# Patient Record
Sex: Female | Born: 1945 | Race: White | Hispanic: No | Smoking: Former smoker
Health system: Southern US, Community
[De-identification: ages and names within clinical notes are randomized; demographics above are authoritative.]

## PROBLEM LIST (undated history)

## (undated) DIAGNOSIS — C801 Malignant (primary) neoplasm, unspecified: Secondary | ICD-10-CM

## (undated) DIAGNOSIS — I10 Essential (primary) hypertension: Secondary | ICD-10-CM

## (undated) DIAGNOSIS — E079 Disorder of thyroid, unspecified: Secondary | ICD-10-CM

## (undated) DIAGNOSIS — E785 Hyperlipidemia, unspecified: Secondary | ICD-10-CM

## (undated) DIAGNOSIS — F419 Anxiety disorder, unspecified: Secondary | ICD-10-CM

## (undated) HISTORY — PX: LARYNGETOMY: SHX5202

## (undated) HISTORY — DX: Anxiety disorder, unspecified: F41.9

## (undated) HISTORY — DX: Disorder of thyroid, unspecified: E07.9

## (undated) HISTORY — DX: Malignant (primary) neoplasm, unspecified: C80.1

## (undated) HISTORY — PX: TRACHEOSTOMY: SUR1362

## (undated) HISTORY — DX: Hyperlipidemia, unspecified: E78.5

## (undated) HISTORY — DX: Essential (primary) hypertension: I10

---

## 2016-05-04 DIAGNOSIS — M961 Postlaminectomy syndrome, not elsewhere classified: Secondary | ICD-10-CM | POA: Diagnosis not present

## 2016-05-04 DIAGNOSIS — G894 Chronic pain syndrome: Secondary | ICD-10-CM | POA: Diagnosis not present

## 2016-05-04 DIAGNOSIS — G890 Central pain syndrome: Secondary | ICD-10-CM | POA: Diagnosis not present

## 2016-05-10 DIAGNOSIS — C328 Malignant neoplasm of overlapping sites of larynx: Secondary | ICD-10-CM | POA: Diagnosis not present

## 2016-06-02 DIAGNOSIS — G890 Central pain syndrome: Secondary | ICD-10-CM | POA: Diagnosis not present

## 2016-06-02 DIAGNOSIS — G894 Chronic pain syndrome: Secondary | ICD-10-CM | POA: Diagnosis not present

## 2016-06-14 DIAGNOSIS — Z0389 Encounter for observation for other suspected diseases and conditions ruled out: Secondary | ICD-10-CM | POA: Diagnosis not present

## 2016-06-14 DIAGNOSIS — R918 Other nonspecific abnormal finding of lung field: Secondary | ICD-10-CM | POA: Diagnosis not present

## 2016-06-14 DIAGNOSIS — C328 Malignant neoplasm of overlapping sites of larynx: Secondary | ICD-10-CM | POA: Diagnosis not present

## 2016-06-30 DIAGNOSIS — K219 Gastro-esophageal reflux disease without esophagitis: Secondary | ICD-10-CM | POA: Diagnosis not present

## 2016-06-30 DIAGNOSIS — E279 Disorder of adrenal gland, unspecified: Secondary | ICD-10-CM | POA: Diagnosis not present

## 2016-06-30 DIAGNOSIS — E039 Hypothyroidism, unspecified: Secondary | ICD-10-CM | POA: Diagnosis not present

## 2016-06-30 DIAGNOSIS — I10 Essential (primary) hypertension: Secondary | ICD-10-CM | POA: Diagnosis not present

## 2016-06-30 DIAGNOSIS — E784 Other hyperlipidemia: Secondary | ICD-10-CM | POA: Diagnosis not present

## 2016-07-08 DIAGNOSIS — K603 Anal fistula: Secondary | ICD-10-CM | POA: Diagnosis not present

## 2016-07-08 DIAGNOSIS — K602 Anal fissure, unspecified: Secondary | ICD-10-CM | POA: Diagnosis not present

## 2016-07-14 DIAGNOSIS — G894 Chronic pain syndrome: Secondary | ICD-10-CM | POA: Diagnosis not present

## 2016-07-14 DIAGNOSIS — G890 Central pain syndrome: Secondary | ICD-10-CM | POA: Diagnosis not present

## 2016-08-11 DIAGNOSIS — G894 Chronic pain syndrome: Secondary | ICD-10-CM | POA: Diagnosis not present

## 2016-08-11 DIAGNOSIS — G890 Central pain syndrome: Secondary | ICD-10-CM | POA: Diagnosis not present

## 2016-08-15 DIAGNOSIS — C329 Malignant neoplasm of larynx, unspecified: Secondary | ICD-10-CM | POA: Diagnosis not present

## 2016-08-15 DIAGNOSIS — E7800 Pure hypercholesterolemia, unspecified: Secondary | ICD-10-CM | POA: Diagnosis not present

## 2016-08-15 DIAGNOSIS — J398 Other specified diseases of upper respiratory tract: Secondary | ICD-10-CM | POA: Diagnosis not present

## 2016-08-15 DIAGNOSIS — R491 Aphonia: Secondary | ICD-10-CM | POA: Diagnosis not present

## 2016-08-22 DIAGNOSIS — R9431 Abnormal electrocardiogram [ECG] [EKG]: Secondary | ICD-10-CM | POA: Diagnosis not present

## 2016-08-22 DIAGNOSIS — Z01811 Encounter for preprocedural respiratory examination: Secondary | ICD-10-CM | POA: Diagnosis not present

## 2016-08-29 DIAGNOSIS — R21 Rash and other nonspecific skin eruption: Secondary | ICD-10-CM | POA: Diagnosis not present

## 2016-08-29 DIAGNOSIS — L309 Dermatitis, unspecified: Secondary | ICD-10-CM | POA: Diagnosis not present

## 2016-08-31 DIAGNOSIS — J9503 Malfunction of tracheostomy stoma: Secondary | ICD-10-CM | POA: Diagnosis not present

## 2016-08-31 DIAGNOSIS — Z923 Personal history of irradiation: Secondary | ICD-10-CM | POA: Diagnosis not present

## 2016-08-31 DIAGNOSIS — Z87891 Personal history of nicotine dependence: Secondary | ICD-10-CM | POA: Diagnosis not present

## 2016-08-31 DIAGNOSIS — I10 Essential (primary) hypertension: Secondary | ICD-10-CM | POA: Diagnosis not present

## 2016-08-31 DIAGNOSIS — Z0389 Encounter for observation for other suspected diseases and conditions ruled out: Secondary | ICD-10-CM | POA: Diagnosis not present

## 2016-08-31 DIAGNOSIS — J9509 Other tracheostomy complication: Secondary | ICD-10-CM | POA: Diagnosis not present

## 2016-08-31 DIAGNOSIS — J398 Other specified diseases of upper respiratory tract: Secondary | ICD-10-CM | POA: Diagnosis not present

## 2016-08-31 DIAGNOSIS — Z8521 Personal history of malignant neoplasm of larynx: Secondary | ICD-10-CM | POA: Diagnosis not present

## 2016-09-01 DIAGNOSIS — Z923 Personal history of irradiation: Secondary | ICD-10-CM | POA: Diagnosis not present

## 2016-09-01 DIAGNOSIS — Z87891 Personal history of nicotine dependence: Secondary | ICD-10-CM | POA: Diagnosis not present

## 2016-09-01 DIAGNOSIS — J398 Other specified diseases of upper respiratory tract: Secondary | ICD-10-CM | POA: Diagnosis not present

## 2016-09-01 DIAGNOSIS — I10 Essential (primary) hypertension: Secondary | ICD-10-CM | POA: Diagnosis not present

## 2016-09-01 DIAGNOSIS — Z8521 Personal history of malignant neoplasm of larynx: Secondary | ICD-10-CM | POA: Diagnosis not present

## 2016-09-09 DIAGNOSIS — C329 Malignant neoplasm of larynx, unspecified: Secondary | ICD-10-CM | POA: Diagnosis not present

## 2016-09-14 DIAGNOSIS — G894 Chronic pain syndrome: Secondary | ICD-10-CM | POA: Diagnosis not present

## 2016-09-14 DIAGNOSIS — G890 Central pain syndrome: Secondary | ICD-10-CM | POA: Diagnosis not present

## 2016-09-30 DIAGNOSIS — C329 Malignant neoplasm of larynx, unspecified: Secondary | ICD-10-CM | POA: Diagnosis not present

## 2016-10-03 DIAGNOSIS — I10 Essential (primary) hypertension: Secondary | ICD-10-CM | POA: Diagnosis not present

## 2016-10-03 DIAGNOSIS — E784 Other hyperlipidemia: Secondary | ICD-10-CM | POA: Diagnosis not present

## 2016-10-03 DIAGNOSIS — R918 Other nonspecific abnormal finding of lung field: Secondary | ICD-10-CM | POA: Diagnosis not present

## 2016-10-03 DIAGNOSIS — Z86008 Personal history of in-situ neoplasm of other site: Secondary | ICD-10-CM | POA: Diagnosis not present

## 2016-10-03 DIAGNOSIS — K219 Gastro-esophageal reflux disease without esophagitis: Secondary | ICD-10-CM | POA: Diagnosis not present

## 2016-10-03 DIAGNOSIS — Z9889 Other specified postprocedural states: Secondary | ICD-10-CM | POA: Diagnosis not present

## 2016-10-03 DIAGNOSIS — E279 Disorder of adrenal gland, unspecified: Secondary | ICD-10-CM | POA: Diagnosis not present

## 2016-10-03 DIAGNOSIS — E039 Hypothyroidism, unspecified: Secondary | ICD-10-CM | POA: Diagnosis not present

## 2016-10-03 DIAGNOSIS — J432 Centrilobular emphysema: Secondary | ICD-10-CM | POA: Diagnosis not present

## 2016-12-12 ENCOUNTER — Ambulatory Visit (INDEPENDENT_AMBULATORY_CARE_PROVIDER_SITE_OTHER): Payer: Medicare Other | Admitting: Family Medicine

## 2016-12-12 ENCOUNTER — Encounter: Payer: Self-pay | Admitting: Family Medicine

## 2016-12-12 VITALS — BP 122/80 | HR 72 | Resp 12 | Ht 63.25 in | Wt 166.5 lb

## 2016-12-12 DIAGNOSIS — E785 Hyperlipidemia, unspecified: Secondary | ICD-10-CM

## 2016-12-12 DIAGNOSIS — E039 Hypothyroidism, unspecified: Secondary | ICD-10-CM | POA: Diagnosis not present

## 2016-12-12 DIAGNOSIS — I10 Essential (primary) hypertension: Secondary | ICD-10-CM | POA: Diagnosis not present

## 2016-12-12 DIAGNOSIS — G894 Chronic pain syndrome: Secondary | ICD-10-CM | POA: Diagnosis not present

## 2016-12-12 DIAGNOSIS — Z930 Tracheostomy status: Secondary | ICD-10-CM | POA: Diagnosis not present

## 2016-12-12 DIAGNOSIS — Z8521 Personal history of malignant neoplasm of larynx: Secondary | ICD-10-CM

## 2016-12-12 DIAGNOSIS — F419 Anxiety disorder, unspecified: Secondary | ICD-10-CM | POA: Diagnosis not present

## 2016-12-12 MED ORDER — ATORVASTATIN CALCIUM 10 MG PO TABS: 10.0000 mg | ORAL_TABLET | Freq: Every day | ORAL | 3 refills | Status: DC

## 2016-12-12 MED ORDER — OXYCODONE-ACETAMINOPHEN 5-325 MG PO TABS: 1.0000 | ORAL_TABLET | Freq: Two times a day (BID) | ORAL | 0 refills | Status: DC | PRN

## 2016-12-12 MED ORDER — CARVEDILOL 6.25 MG PO TABS: 6.2500 mg | ORAL_TABLET | Freq: Two times a day (BID) | ORAL | 1 refills | Status: DC

## 2016-12-12 MED ORDER — LOSARTAN POTASSIUM 50 MG PO TABS: 50.0000 mg | ORAL_TABLET | Freq: Every day | ORAL | 1 refills | Status: DC

## 2016-12-12 MED ORDER — LEVOTHYROXINE SODIUM 88 MCG PO TABS: 88.0000 ug | ORAL_TABLET | Freq: Every day | ORAL | 2 refills | Status: DC

## 2016-12-12 MED ORDER — AMLODIPINE BESYLATE 10 MG PO TABS: 10.0000 mg | ORAL_TABLET | Freq: Every day | ORAL | 2 refills | Status: DC

## 2016-12-12 NOTE — Patient Instructions (Addendum)
A few things to remember from today's visit:   Chronic pain disorder - Plan: Ambulatory referral to Pain Clinic, amLODipine (NORVASC) 10 MG tablet, oxyCODONE-acetaminophen (PERCOCET/ROXICET) 5-325 MG tablet  Hypothyroidism, unspecified type - Plan: levothyroxine (SYNTHROID, LEVOTHROID) 88 MCG tablet  Hypertension, essential, benign - Plan: carvedilol (COREG) 6.25 MG tablet, losartan (COZAAR) 50 MG tablet  Hyperlipidemia, unspecified hyperlipidemia type - Plan: atorvastatin (LIPITOR) 10 MG tablet  Anxiety disorder, unspecified type  Tracheostomy status ([place]) - Plan: Ambulatory referral to ENT, oxyCODONE-acetaminophen (PERCOCET/ROXICET) 5-325 MG tablet  History of laryngeal cancer - Plan: Ambulatory referral to ENT, Ambulatory referral to Oncology   Referral placed. No changes today.  Please be sure medication list is accurate. If a new problem present, please set up appointment sooner than planned today.

## 2016-12-12 NOTE — Progress Notes (Signed)
HPI:   Erika Preston is a 71 y.o. female, who is here today to establish care.  She is here today with her son, she has trach (s/p laryngectomy). Her son is viiting from [place] and helps with answering questions.  Also here is her grandson.  She lives with her daughter, who is out of town, so could not be here.   Former PCP: Dr Kathleen Lime, moved from [place] in 10/2016.  Last preventive routine visit: within the past year.   Chronic medical problems: HTN, hypothyroidism,HLD, and chronic pain among some.  Hypertension:  Dx around 2014.  Currently on Carvedilol 6.25 mg bid, Amlodipine 10 mg daily, and Cozaar 50 mg daily.  She is taking medications as instructed, no side effects reported.  She has not noted unusual headache, visual changes, exertional chest pain, dyspnea,  focal weakness, or edema.   Concerns today: Medication refills, including Percocet.  Requesting referrals to pain management, oncologists,and ENT.   She is on Percocet for pain and cough management, both related to her Hx of laryngeal cancer. Laryngeal cancer Dx in ? 2015. She underwent surgery,chemo, and radiotherapy. She has no residual dysphagia.  She is on Percocet 5-325 mg bid as needed. She still has tabs left, last filled on   She is also on Cymbalta 60 mg, she is not sure about indication but she has Hx of anxiety.  HLD: She is on Lipitor 10 mg daily.  She does not follow a healthy diet or exercise regularly.  Hypothyroidism:  Currently she is on Levothyroxine 88 mcg daily. Tolerating medication well, no side effects reported. She has not noted palpitations, abdominal pain, changes in bowel habits, tremor, cold/heat intolerance, or abnormal weight loss.   Review of Systems  Constitutional: Negative for activity change, appetite change, fatigue and fever.  HENT: Negative for facial swelling, mouth sores, nosebleeds, sore throat and trouble swallowing.   Eyes: Negative for redness and  visual disturbance.  Respiratory: Positive for cough (stable). Negative for shortness of breath and wheezing.   Cardiovascular: Negative for chest pain, palpitations and leg swelling.  Gastrointestinal: Negative for abdominal pain, nausea and vomiting.       Negative for changes in bowel habits.  Endocrine: Negative for cold intolerance and heat intolerance.  Genitourinary: Negative for decreased urine volume, dysuria and hematuria.  Musculoskeletal: Negative for gait problem and myalgias.  Skin: Negative for rash.  Neurological: Negative for syncope, weakness and headaches.  Psychiatric/Behavioral: Negative for confusion. The patient is nervous/anxious.     No current outpatient prescriptions on file prior to visit.   No current facility-administered medications on file prior to visit.      Past Medical History:  Diagnosis Date  . Anxiety   . Cancer (HCC)    laryngeal  . Hyperlipidemia   . Hypertension   . Thyroid disease    Past Surgical History:  Procedure Laterality Date  . LARYNGETOMY    . TRACHEOSTOMY      No Known Allergies  Family History  Problem Relation Age of Onset  . Arthritis Mother   . Cancer Neg Hx   . Diabetes Neg Hx     Social History   Social History  . Marital status: Divorced    Spouse name: N/A  . Number of children: N/A  . Years of education: N/A   Social History Main Topics  . Smoking status: Former Research scientist (life sciences)  . Smokeless tobacco: Never Used  . Alcohol use Yes  . Drug  use: No  . Sexual activity: Not Currently   Other Topics Concern  . None   Social History Narrative  . None    Vitals:   12/12/16 1352  BP: 122/80  Pulse: 72  Resp: 12  SpO2: 96%    Body mass index is 29.26 kg/m.   Physical Exam  Nursing note and vitals reviewed. Constitutional: She is oriented to person, place, and time. She appears well-developed. No distress.  HENT:  Head: Normocephalic and atraumatic.  Mouth/Throat: Oropharynx is clear and moist  and mucous membranes are normal.  Eyes: Pupils are equal, round, and reactive to light. Conjunctivae and EOM are normal.  Neck: No tracheal tenderness present. No tracheal deviation present.  Trach in place.  Cardiovascular: Normal rate and regular rhythm.   No murmur heard. Pulses:      Dorsalis pedis pulses are 2+ on the right side, and 2+ on the left side.  Respiratory: Effort normal and breath sounds normal. No respiratory distress.  Transmitted noise from upper airway.  GI: Soft. She exhibits no mass. There is no hepatomegaly. There is no tenderness.  Musculoskeletal: She exhibits no edema or tenderness.  Lymphadenopathy:    She has no cervical adenopathy.  Neurological: She is alert and oriented to person, place, and time. She has normal strength. She displays tremor (Mild head tremor.). Gait normal.  Skin: Skin is warm. No erythema.  Psychiatric: Her mood appears anxious.  Well groomed, good eye contact.    ASSESSMENT AND PLAN:   [name] was seen today for establish care.  Diagnoses and all orders for this visit:  History of laryngeal cancer  Communication is difficult and her son does not have detail information about this problem. We are waiting for records from former PCP. Referral placed.  -     Ambulatory referral to ENT -     Ambulatory referral to Oncology  Chronic pain disorder  Residual after laryngostomy and radiation therapy. Today I gave her a month Rx for Percocet, post dated. Educated about regulations in regard to opioid management and side effects. Referral placed as requested.  -     Ambulatory referral to Pain Clinic -     amLODipine (NORVASC) 10 MG tablet; Take 1 tablet (10 mg total) by mouth daily. -     oxyCODONE-acetaminophen (PERCOCET/ROXICET) 5-325 MG tablet; Take 1 tablet by mouth 2 (two) times daily as needed for severe pain.  Hypothyroidism, unspecified type  Reporting labs done in 6-10/2016. No changes in current  management. F/U in 4 months.  -     levothyroxine (SYNTHROID, LEVOTHROID) 88 MCG tablet; Take 1 tablet (88 mcg total) by mouth daily.  Hypertension, essential, benign  Adequately controlled. No changes in current management. DASH diet recommended. Eye exam recommended annually. F/U in 4 months, before if needed.  -     carvedilol (COREG) 6.25 MG tablet; Take 1 tablet (6.25 mg total) by mouth 2 (two) times daily. -     losartan (COZAAR) 50 MG tablet; Take 1 tablet (50 mg total) by mouth daily.  Hyperlipidemia, unspecified hyperlipidemia type  Stable, per pt report FLP a couple months ago. No changes in current management. F/U in 6-12 months.  -     atorvastatin (LIPITOR) 10 MG tablet; Take 1 tablet (10 mg total) by mouth daily.  Anxiety disorder, unspecified type  Continue Cymbalta 60 mg daily. F/U in 3-4 months.  Tracheostomy status ([place])  Continue following with ENT.  -     Ambulatory  referral to ENT -     oxyCODONE-acetaminophen (PERCOCET/ROXICET) 5-325 MG tablet; Take 1 tablet by mouth 2 (two) times daily as needed for severe pain.    She has not had colonoscopy or mammogram, refused both.   Ege Muckey G. Martinique, MD  [facility] [place] office.

## 2016-12-15 ENCOUNTER — Encounter: Payer: Self-pay | Admitting: Family Medicine

## 2016-12-20 DIAGNOSIS — Z8521 Personal history of malignant neoplasm of larynx: Secondary | ICD-10-CM | POA: Diagnosis not present

## 2016-12-20 DIAGNOSIS — Z938 Other artificial opening status: Secondary | ICD-10-CM | POA: Diagnosis not present

## 2016-12-20 DIAGNOSIS — L598 Other specified disorders of the skin and subcutaneous tissue related to radiation: Secondary | ICD-10-CM | POA: Diagnosis not present

## 2016-12-20 DIAGNOSIS — Z9002 Acquired absence of larynx: Secondary | ICD-10-CM | POA: Diagnosis not present

## 2016-12-20 DIAGNOSIS — Z87891 Personal history of nicotine dependence: Secondary | ICD-10-CM | POA: Diagnosis not present

## 2016-12-20 DIAGNOSIS — Z923 Personal history of irradiation: Secondary | ICD-10-CM | POA: Diagnosis not present

## 2016-12-20 DIAGNOSIS — Z963 Presence of artificial larynx: Secondary | ICD-10-CM | POA: Diagnosis not present

## 2016-12-20 DIAGNOSIS — Z7289 Other problems related to lifestyle: Secondary | ICD-10-CM | POA: Diagnosis not present

## 2016-12-21 DIAGNOSIS — M25774 Osteophyte, right foot: Secondary | ICD-10-CM | POA: Diagnosis not present

## 2016-12-21 DIAGNOSIS — L600 Ingrowing nail: Secondary | ICD-10-CM | POA: Diagnosis not present

## 2017-01-05 DIAGNOSIS — G894 Chronic pain syndrome: Secondary | ICD-10-CM | POA: Diagnosis not present

## 2017-01-05 DIAGNOSIS — Z79891 Long term (current) use of opiate analgesic: Secondary | ICD-10-CM | POA: Diagnosis not present

## 2017-01-05 DIAGNOSIS — M542 Cervicalgia: Secondary | ICD-10-CM | POA: Diagnosis not present

## 2017-01-05 DIAGNOSIS — Z79899 Other long term (current) drug therapy: Secondary | ICD-10-CM | POA: Diagnosis not present

## 2017-01-05 DIAGNOSIS — J701 Chronic and other pulmonary manifestations due to radiation: Secondary | ICD-10-CM | POA: Diagnosis not present

## 2017-01-09 DIAGNOSIS — Z08 Encounter for follow-up examination after completed treatment for malignant neoplasm: Secondary | ICD-10-CM | POA: Diagnosis not present

## 2017-01-09 DIAGNOSIS — Z9002 Acquired absence of larynx: Secondary | ICD-10-CM | POA: Diagnosis not present

## 2017-01-09 DIAGNOSIS — Z8521 Personal history of malignant neoplasm of larynx: Secondary | ICD-10-CM | POA: Diagnosis not present

## 2017-01-31 DIAGNOSIS — G894 Chronic pain syndrome: Secondary | ICD-10-CM | POA: Diagnosis not present

## 2017-01-31 DIAGNOSIS — Z79899 Other long term (current) drug therapy: Secondary | ICD-10-CM | POA: Diagnosis not present

## 2017-01-31 DIAGNOSIS — M542 Cervicalgia: Secondary | ICD-10-CM | POA: Diagnosis not present

## 2017-01-31 DIAGNOSIS — J701 Chronic and other pulmonary manifestations due to radiation: Secondary | ICD-10-CM | POA: Diagnosis not present

## 2017-01-31 DIAGNOSIS — Z79891 Long term (current) use of opiate analgesic: Secondary | ICD-10-CM | POA: Diagnosis not present

## 2017-02-23 DIAGNOSIS — R490 Dysphonia: Secondary | ICD-10-CM | POA: Diagnosis not present

## 2017-02-28 DIAGNOSIS — M542 Cervicalgia: Secondary | ICD-10-CM | POA: Diagnosis not present

## 2017-02-28 DIAGNOSIS — Z79891 Long term (current) use of opiate analgesic: Secondary | ICD-10-CM | POA: Diagnosis not present

## 2017-02-28 DIAGNOSIS — J701 Chronic and other pulmonary manifestations due to radiation: Secondary | ICD-10-CM | POA: Diagnosis not present

## 2017-02-28 DIAGNOSIS — Z79899 Other long term (current) drug therapy: Secondary | ICD-10-CM | POA: Diagnosis not present

## 2017-02-28 DIAGNOSIS — G894 Chronic pain syndrome: Secondary | ICD-10-CM | POA: Diagnosis not present

## 2017-03-07 ENCOUNTER — Ambulatory Visit (INDEPENDENT_AMBULATORY_CARE_PROVIDER_SITE_OTHER): Payer: Medicare Other | Admitting: Family Medicine

## 2017-03-07 ENCOUNTER — Encounter: Payer: Self-pay | Admitting: Family Medicine

## 2017-03-07 VITALS — BP 126/72 | HR 70 | Temp 98.3°F | Resp 12 | Ht 63.25 in | Wt 172.5 lb

## 2017-03-07 DIAGNOSIS — E785 Hyperlipidemia, unspecified: Secondary | ICD-10-CM

## 2017-03-07 DIAGNOSIS — Z23 Encounter for immunization: Secondary | ICD-10-CM | POA: Diagnosis not present

## 2017-03-07 DIAGNOSIS — E039 Hypothyroidism, unspecified: Secondary | ICD-10-CM | POA: Diagnosis not present

## 2017-03-07 DIAGNOSIS — Z930 Tracheostomy status: Secondary | ICD-10-CM | POA: Diagnosis not present

## 2017-03-07 DIAGNOSIS — I10 Essential (primary) hypertension: Secondary | ICD-10-CM | POA: Diagnosis not present

## 2017-03-07 DIAGNOSIS — F419 Anxiety disorder, unspecified: Secondary | ICD-10-CM | POA: Diagnosis not present

## 2017-03-07 MED ORDER — DULOXETINE HCL 60 MG PO CPEP: 60.0000 mg | ORAL_CAPSULE | Freq: Every day | ORAL | 1 refills | Status: DC

## 2017-03-07 MED ORDER — ATORVASTATIN CALCIUM 10 MG PO TABS: 10.0000 mg | ORAL_TABLET | Freq: Every day | ORAL | 3 refills | Status: DC

## 2017-03-07 MED ORDER — ALPRAZOLAM 0.25 MG PO TABS: 0.2500 mg | ORAL_TABLET | Freq: Every day | ORAL | 2 refills | Status: DC

## 2017-03-07 MED ORDER — CARVEDILOL 6.25 MG PO TABS: 6.2500 mg | ORAL_TABLET | Freq: Two times a day (BID) | ORAL | 2 refills | Status: DC

## 2017-03-07 MED ORDER — AMLODIPINE BESYLATE 10 MG PO TABS: 10.0000 mg | ORAL_TABLET | Freq: Every day | ORAL | 2 refills | Status: DC

## 2017-03-07 MED ORDER — SODIUM CHLORIDE 0.9 % IN NEBU: 3.0000 mL | INHALATION_SOLUTION | RESPIRATORY_TRACT | 6 refills | Status: AC | PRN

## 2017-03-07 MED ORDER — LOSARTAN POTASSIUM 50 MG PO TABS: 50.0000 mg | ORAL_TABLET | Freq: Every day | ORAL | 2 refills | Status: DC

## 2017-03-07 MED ORDER — LEVOTHYROXINE SODIUM 88 MCG PO TABS: 88.0000 ug | ORAL_TABLET | Freq: Every day | ORAL | 2 refills | Status: DC

## 2017-03-07 NOTE — Progress Notes (Signed)
HPI:   [name] is a 71 y.o. female, who is here today with her daughter to follow on some chronic medical problems. Her daughter has been with history.  I saw [name] on 12/12/2016. She has history of hypertension, hypothyroidism,hyperlipidemia, chronic pain, history of laryngeal cancer(S/P laryngetomy and tracheostomy), chronic cough, and anxiety among some.  Last office visit she was referred to ENT and oncology as well as pain clinic.   Since her last office visit she has follow with pain management and ENT. She has not seen oncologist.  According to the daughter, she was released from oncologist because she was not interested in pursuing further treatment.  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  Anxiety: Currently she is on Cymbalta 60 mg daily. Today she is also requesting refills for Xanax 0.25 mg, which she has been taking for 1-2 years, once daily at night. She denies side effects from medications. She denies suicidal thoughts.    Hypertension:  Currently on Carvedilol 6.25 mg twice daily, Amlodipine 10 mg daily, and Cozaar 50 mg daily.  Last eye exam within a year ago.  She does not follow a low-salt diet. She is taking medications as instructed, no side effects reported.  She has not noted unusual headache, visual changes, exertional chest pain, dyspnea, focal weakness, or edema.   Hyperlipidemia, currently on Lipitor 10 mg daily She is not fasting today. Not sure about last lipid panel.  Hypothyroidism: Currently she is on levothyroxine 88 g daily.  She has not noted changes in bowel movements, tremor, cold/heat intolerance, or abnormal weight loss.   Concerns today:  Mucus around trach that is causing her to cough and interfering with sleep. According to her daughter, she has a hard time coughing up thick mucus through the tracheostomy,  frequently she uses tweezers to remove mucus she can reach through the trach valve. She gets up about 2-7 times per night to cough up mucus. Does not try OTC medication. Humidifier in her room has not helped.  She denies chills, fever,wheezing, or pain around tracheostomy site. According to patient and daughter, further recommendations in this regard were not given by ENT.   Review of Systems  Constitutional: Negative for activity change, appetite change, fatigue and fever.  HENT: Positive for postnasal drip. Negative for mouth sores, nosebleeds, sore throat and trouble swallowing.   Eyes: Negative for redness and visual disturbance.  Respiratory: Positive for cough. Negative for shortness of breath and wheezing.   Cardiovascular: Negative for chest pain, palpitations and leg swelling.  Gastrointestinal: Negative for abdominal pain, nausea and vomiting.       Negative for changes in bowel habits.  Endocrine: Negative for cold intolerance and heat intolerance.  Genitourinary: Negative for decreased urine volume and hematuria.  Musculoskeletal: Negative for gait problem and myalgias.  Neurological: Negative for syncope, weakness and headaches.  Psychiatric/Behavioral: Positive for sleep disturbance. Negative for confusion and suicidal ideas. The patient is nervous/anxious.       Current Outpatient Medications on File Prior to Visit  Medication Sig Dispense Refill  . oxyCODONE-acetaminophen (PERCOCET/ROXICET) 5-325 MG tablet Take 1 tablet by mouth 2 (two) times daily as needed for severe pain. 60 tablet 0  . VITAMIN E PO Take 1 tablet by mouth daily.     No current facility-administered medications on file prior to visit.      Past Medical  History:  Diagnosis Date  . Anxiety   . Cancer (HCC)    laryngeal  . Hyperlipidemia   . Hypertension   . Thyroid disease    No Known Allergies  Social History   Socioeconomic History  . Marital status: Divorced    Spouse name: None    . Number of children: None  . Years of education: None  . Highest education level: None  Social Needs  . Financial resource strain: None  . Food insecurity - worry: None  . Food insecurity - inability: None  . Transportation needs - medical: None  . Transportation needs - non-medical: None  Occupational History  . None  Tobacco Use  . Smoking status: Former Research scientist (life sciences)  . Smokeless tobacco: Never Used  Substance and Sexual Activity  . Alcohol use: Yes  . Drug use: No  . Sexual activity: Not Currently  Other Topics Concern  . None  Social History Narrative  . None    Vitals:   03/07/17 1449  BP: 126/72  Pulse: 70  Resp: 12  Temp: 98.3 F (36.8 C)  SpO2: 97%   Body mass index is 30.32 kg/m.  Physical Exam  Nursing note and vitals reviewed. Constitutional: She is oriented to person, place, and time. She appears well-developed. No distress.  HENT:  Head: Normocephalic and atraumatic.  Mouth/Throat: Oropharynx is clear and moist and mucous membranes are normal.  Eyes: Conjunctivae are normal. Pupils are equal, round, and reactive to light.  Neck: No tracheal tenderness present. No tracheal deviation present.  Trach valve in place. During visit she tries to cough up mucus and cleans trach valve a few times.   Cardiovascular: Normal rate and regular rhythm.  No murmur heard. Pulses:      Dorsalis pedis pulses are 2+ on the right side, and 2+ on the left side.  Respiratory: Effort normal and breath sounds normal. No respiratory distress.  GI: Soft. She exhibits no mass. There is no hepatomegaly. There is no tenderness.  Musculoskeletal: She exhibits no edema.  Lymphadenopathy:    She has no cervical adenopathy.  Neurological: She is alert and oriented to person, place, and time. She has normal strength. Coordination normal.  Skin: Skin is warm. No erythema.  Psychiatric: She has a normal mood and affect.  Well groomed, good eye contact.     ASSESSMENT AND  PLAN:   [name] was seen today for follow-up.  Diagnoses and all orders for this visit:  Lab Results  Component Value Date   TSH 5.19 (H) 03/07/2017   Lab Results  Component Value Date   CREATININE 1.34 (H) 03/07/2017   BUN 33 (H) 03/07/2017   NA 137 03/07/2017   K 5.0 03/07/2017   CL 103 03/07/2017   CO2 20 03/07/2017    Tracheostomy status (HCC)  Saline nebs may help. Plain Mucinex can also be tried. If not improved, we will try to obtain suction device to remove mucus around trach valve.  -     sodium chloride 0.9 % nebulizer solution; Take 3 mLs as needed by nebulization (through trach). -     DME Nebulizer machine  Hypothyroidism, unspecified type  No changes in current management, will follow labs done today and will give further recommendations accordingly. F/U in 6-12 months.  -     T4, free -     TSH -     levothyroxine (SYNTHROID, LEVOTHROID) 88 MCG tablet; Take 1 tablet (88 mcg total) daily by mouth.  Hypertension,  essential, benign  Adequately controlled. No changes in current management. DASH diet recommended. Eye exam recommended annually. F/U in 4-5 months, before if needed.  -     Basic metabolic panel -     amLODipine (NORVASC) 10 MG tablet; Take 1 tablet (10 mg total) daily by mouth. -     carvedilol (COREG) 6.25 MG tablet; Take 1 tablet (6.25 mg total) 2 (two) times daily by mouth. -     losartan (COZAAR) 50 MG tablet; Take 1 tablet (50 mg total) daily by mouth.  Hyperlipidemia, unspecified hyperlipidemia type  She is not fasting today, we will plan on lipid panel at next visit. Low-fat diet recommended. No changes in Lipitor dose.  -     atorvastatin (LIPITOR) 10 MG tablet; Take 1 tablet (10 mg total) daily by mouth.  Anxiety disorder, unspecified type  Stable. We discussed some side effects of Alprazolam as well as Cymbalta. No changes in current management. Follow-up in 4 months.  -     ALPRAZolam (XANAX) 0.25 MG tablet;  Take 1 tablet (0.25 mg total) at bedtime by mouth. -     DULoxetine (CYMBALTA) 60 MG capsule; Take 1 capsule (60 mg total) daily by mouth.  Need for influenza vaccination -     Flu vaccine HIGH DOSE PF    -[name] was advised to return sooner than planned today if new concerns arise.       Theophile Harvie G. Martinique, MD  [facility]. [place] office.

## 2017-03-07 NOTE — Patient Instructions (Signed)
A few things to remember from today's visit:   Anxiety disorder, unspecified type - Plan: ALPRAZolam (XANAX) 0.25 MG tablet, DULoxetine (CYMBALTA) 60 MG capsule, DISCONTINUED: ALPRAZolam (XANAX) 0.25 MG tablet  Hypothyroidism, unspecified type - Plan: T4, free, TSH, levothyroxine (SYNTHROID, LEVOTHROID) 88 MCG tablet  Tracheostomy status (HCC) - Plan: sodium chloride 0.9 % nebulizer solution  Hypertension, essential, benign - Plan: Basic metabolic panel, carvedilol (COREG) 6.25 MG tablet, losartan (COZAAR) 50 MG tablet  Chronic pain disorder - Plan: amLODipine (NORVASC) 10 MG tablet, DULoxetine (CYMBALTA) 60 MG capsule  Hyperlipidemia, unspecified hyperlipidemia type - Plan: atorvastatin (LIPITOR) 10 MG tablet  No changes in meds today. Try saline neb treatment at bedtime to help with thick mucus around trach.   Please be sure medication list is accurate. If a new problem present, please set up appointment sooner than planned today.

## 2017-03-08 LAB — BASIC METABOLIC PANEL
BUN: 33 mg/dL — ABNORMAL HIGH (ref 6–23)
CO2: 20 mEq/L (ref 19–32)
Calcium: 10.1 mg/dL (ref 8.4–10.5)
Chloride: 103 mEq/L (ref 96–112)
Creatinine, Ser: 1.34 mg/dL — ABNORMAL HIGH (ref 0.40–1.20)
GFR: 41.46 mL/min — AB (ref >60.00)
Glucose, Bld: 101 mg/dL — ABNORMAL HIGH (ref 70–99)
POTASSIUM: 5 meq/L (ref 3.5–5.1)
SODIUM: 137 meq/L (ref 135–145)

## 2017-03-08 LAB — TSH: TSH: 5.19 u[IU]/mL — AB (ref 0.35–4.50)

## 2017-03-08 LAB — T4, FREE: FREE T4: 0.84 ng/dL (ref 0.60–1.60)

## 2017-03-10 ENCOUNTER — Telehealth: Payer: Self-pay | Admitting: Family Medicine

## 2017-03-10 NOTE — Telephone Encounter (Signed)
Spoke with patient about lab results and instructions. Patient verbalized understanding.

## 2017-03-10 NOTE — Telephone Encounter (Signed)
Pt is returning Erika Preston call

## 2017-03-14 ENCOUNTER — Ambulatory Visit: Payer: Medicare Other | Admitting: Family Medicine

## 2017-03-28 DIAGNOSIS — G894 Chronic pain syndrome: Secondary | ICD-10-CM | POA: Diagnosis not present

## 2017-03-28 DIAGNOSIS — M542 Cervicalgia: Secondary | ICD-10-CM | POA: Diagnosis not present

## 2017-03-28 DIAGNOSIS — J701 Chronic and other pulmonary manifestations due to radiation: Secondary | ICD-10-CM | POA: Diagnosis not present

## 2017-04-28 DIAGNOSIS — Z79899 Other long term (current) drug therapy: Secondary | ICD-10-CM | POA: Diagnosis not present

## 2017-04-28 DIAGNOSIS — Z79891 Long term (current) use of opiate analgesic: Secondary | ICD-10-CM | POA: Diagnosis not present

## 2017-04-28 DIAGNOSIS — J701 Chronic and other pulmonary manifestations due to radiation: Secondary | ICD-10-CM | POA: Diagnosis not present

## 2017-04-28 DIAGNOSIS — M542 Cervicalgia: Secondary | ICD-10-CM | POA: Diagnosis not present

## 2017-04-28 DIAGNOSIS — G894 Chronic pain syndrome: Secondary | ICD-10-CM | POA: Diagnosis not present

## 2017-06-27 DIAGNOSIS — Z79891 Long term (current) use of opiate analgesic: Secondary | ICD-10-CM | POA: Diagnosis not present

## 2017-06-27 DIAGNOSIS — G894 Chronic pain syndrome: Secondary | ICD-10-CM | POA: Diagnosis not present

## 2017-06-27 DIAGNOSIS — M542 Cervicalgia: Secondary | ICD-10-CM | POA: Diagnosis not present

## 2017-06-27 DIAGNOSIS — Z79899 Other long term (current) drug therapy: Secondary | ICD-10-CM | POA: Diagnosis not present

## 2017-06-27 DIAGNOSIS — J701 Chronic and other pulmonary manifestations due to radiation: Secondary | ICD-10-CM | POA: Diagnosis not present

## 2017-06-30 ENCOUNTER — Telehealth: Payer: Self-pay | Admitting: *Deleted

## 2017-06-30 NOTE — Telephone Encounter (Signed)
Spoke with Debroah Baller, she stated that she was told by Dr. Martinique to call if  Mrs. Kommer needed some medication for the nebulizer and she was calling because she needed Rx sent to pharmacy.

## 2017-06-30 NOTE — Telephone Encounter (Signed)
Copied from [place] [id number]. Topic: General - Other >> Jun 30, 2017 11:55 AM Kin Galbraith Stare wrote:  Pt daughter Joseph Art call to ask for a call and refuse to give me any information   [phone]

## 2017-06-30 NOTE — Telephone Encounter (Signed)
Last OV I recommended saline neb treatment to help with thick mucus around tracheostomy.  Thanks, BJ

## 2017-07-03 NOTE — Telephone Encounter (Signed)
Left message for Erika Preston to give clinic a call back.

## 2017-07-04 NOTE — Progress Notes (Signed)
HPI:   [name] is a 72 y.o. female, who is here today for 4 months follow up.   She was last seen on 03/07/2017.  Since her last visit she has been following with [facility]  Anxiety: Currently she is on Cymbalta 60 mg daily and Xanax 0.25 mg daily as needed. She has been on Xanax for about 2-3 years. No depressed mood or suicidal thoughts. She is tolerating medication well, denies side effects.  Hypertension: Currently she is on Carvedilol 6.25 mg twice daily, Amlodipine 10 mg daily, and Cozaar 50 mg daily. BP readings at home: Her daughter checks it once in a while, usually < 140/90.   Last eye exam: A few weeks ago.  Lab Results  Component Value Date   CREATININE 1.34 (H) 03/07/2017   BUN 33 (H) 03/07/2017   NA 137 03/07/2017   K 5.0 03/07/2017   CL 103 03/07/2017   CO2 20 03/07/2017  Denies severe/frequent headache, visual changes, chest pain, dyspnea, palpitation, claudication, focal weakness, or edema.  Hypothyroidism:  Currently she is on Levothyroxine 88 mcg daily. Tolerating medication well, no side effects reported. She has not noted palpitations, abdominal pain, changes in bowel habits, worsening tremor, cold/heat intolerance, or abnormal weight loss.  Lab Results  Component Value Date   TSH 5.19 (H) 03/07/2017     Hyperlipidemia:  Currently on Lipitor 10 mg daily Following a low fat diet: Not consistently..  She has not noted side effects with medication.  Wheezing: According to her daughter, mucosa has been thicker around tracheostomy. It is hard for her to bring mucus up, causing irritation upon doing so, having some bloody mucus. She has not had chills or fever. Itchy eyes and epiphora, eye care provider gave her eyedrops. She is also taking OTC antihistaminic. Productive cough , not able to bring sputum up.  Saline neb treatments initially help with thickening self mucosa around tracheostomy but for the past  3 weeks is not helping. No history of asthma or COPD.  Last appointment with her ENT was about 3 months ago, she is not sure about next follow-up appointment.    Review of Systems  Constitutional: Negative for activity change, appetite change, fatigue and fever.  HENT: Positive for congestion, postnasal drip and rhinorrhea. Negative for ear pain, mouth sores, sinus pressure and sneezing.   Eyes: Positive for discharge and itching. Negative for redness and visual disturbance.  Respiratory: Positive for cough and wheezing. Negative for shortness of breath.   Cardiovascular: Negative for chest pain and leg swelling.  Gastrointestinal: Negative for abdominal pain, nausea and vomiting.       No changes in bowel habits.  Genitourinary: Negative for decreased urine volume and hematuria.  Musculoskeletal: Negative for gait problem and myalgias.  Skin: Negative for rash.  Neurological: Negative for syncope, weakness and headaches.  Hematological: Negative for adenopathy. Does not bruise/bleed easily.  Psychiatric/Behavioral: Negative for confusion. The patient is nervous/anxious.      Current Outpatient Medications on File Prior to Visit  Medication Sig Dispense Refill  . amLODipine (NORVASC) 10 MG tablet Take 1 tablet (10 mg total) daily by mouth. 90 tablet 2  . atorvastatin (LIPITOR) 10 MG tablet Take 1 tablet (10 mg total) daily by mouth. 90 tablet 3  . carvedilol (COREG) 6.25 MG tablet Take 1 tablet (6.25 mg total) 2 (two) times daily by mouth. 180 tablet 2  . DULoxetine (CYMBALTA) 60 MG capsule Take 1 capsule (60 mg  total) daily by mouth. 90 capsule 1  . levothyroxine (SYNTHROID, LEVOTHROID) 88 MCG tablet Take 1 tablet (88 mcg total) daily by mouth. 90 tablet 2  . losartan (COZAAR) 50 MG tablet Take 1 tablet (50 mg total) daily by mouth. 90 tablet 2  . oxyCODONE-acetaminophen (PERCOCET) 10-325 MG tablet 1 tablet up to Four Times A Day as needed  0  . sodium chloride 0.9 % nebulizer  solution Take 3 mLs as needed by nebulization (through trach). 90 mL 6  . VITAMIN E PO Take 1 tablet by mouth daily.     No current facility-administered medications on file prior to visit.      Past Medical History:  Diagnosis Date  . Anxiety   . Cancer (HCC)    laryngeal  . Hyperlipidemia   . Hypertension   . Thyroid disease    No Known Allergies  Social History   Socioeconomic History  . Marital status: Divorced    Spouse name: None  . Number of children: None  . Years of education: None  . Highest education level: None  Social Needs  . Financial resource strain: None  . Food insecurity - worry: None  . Food insecurity - inability: None  . Transportation needs - medical: None  . Transportation needs - non-medical: None  Occupational History  . None  Tobacco Use  . Smoking status: Former Research scientist (life sciences)  . Smokeless tobacco: Never Used  Substance and Sexual Activity  . Alcohol use: Yes  . Drug use: No  . Sexual activity: Not Currently  Other Topics Concern  . None  Social History Narrative  . None    Vitals:   07/05/17 1348  BP: 134/80  Pulse: 84  Resp: 12  Temp: 98.3 F (36.8 C)  SpO2: 96%   Body mass index is 32.03 kg/m.    Physical Exam  Nursing note and vitals reviewed. Constitutional: She is oriented to person, place, and time. She appears well-developed. No distress.  HENT:  Head: Normocephalic and atraumatic.  Mouth/Throat: Oropharynx is clear and moist and mucous membranes are normal.  Eyes: Conjunctivae are normal. Pupils are equal, round, and reactive to light.  Neck: No tracheal deviation present.  Skin around tracheostomy intact.  Cardiovascular: Normal rate and regular rhythm.  No murmur heard. Pulses:      Dorsalis pedis pulses are 2+ on the right side, and 2+ on the left side.  Respiratory: Effort normal. No respiratory distress. She has wheezes. She has rhonchi. She has no rales.  During the visit she cough hard trying to bring mucus  through tracheostomy, a few times she brought blood.  GI: Soft. She exhibits no mass. There is no hepatomegaly. There is no tenderness.  Musculoskeletal: She exhibits no edema.  Lymphadenopathy:    She has no cervical adenopathy.  Neurological: She is alert and oriented to person, place, and time. She has normal strength. Coordination normal.  Skin: Skin is warm. No erythema.  Psychiatric: She has a normal mood and affect.  Well groomed, good eye contact.      ASSESSMENT AND PLAN:   Ms. Micalah Cabezas was seen today for 4 months follow-up.  Orders Placed This Encounter  Procedures  . TSH  . Comprehensive metabolic panel   Lab Results  Component Value Date   ALT 10 07/05/2017   AST 9 07/05/2017   ALKPHOS 90 07/05/2017   BILITOT 0.4 07/05/2017   Lab Results  Component Value Date   CREATININE 1.21 (H) 07/05/2017  BUN 25 (H) 07/05/2017   NA 137 07/05/2017   K 4.5 07/05/2017   CL 102 07/05/2017   CO2 24 07/05/2017   Lab Results  Component Value Date   TSH 0.67 07/05/2017     Hypertension, essential, benign Adequately controlled. No changes in current management. Low-salt diet to continue. Eye exam recommended annually. F/U in 6 months, before if needed.   Hyperlipidemia She is not fasting today, so we will plan on checking lipid panel next visit. Continue Lipitor 10 mg and low fat diet.  Hypothyroidism, unspecified No changes in current management, will follow labs done today and will give further recommendations accordingly.   Reactive airway disease that is not asthma  Wheezing improves with Albuterol neb, still rhonchi diffuse bilateral. Albuterol neb tid for 5-7 days then as needed. Side effects discussed. Her daughter is a Marine scientist, she will monitor and let me know if symptoms get worse.  - predniSONE (DELTASONE) 20 MG tablet; Take 2 tablets (40 mg total) by mouth daily with breakfast for 5 days.  Dispense: 10 tablet; Refill: 0 - albuterol  (PROVENTIL) (2.5 MG/3ML) 0.083% nebulizer solution; Take 3 mLs (2.5 mg total) by nebulization every 6 (six) hours as needed for wheezing or shortness of breath.  Dispense: 150 mL; Refill: 1  Respiratory tract infection  Bronchitis. Because persistent symptoms abx recommended. Explained that congestion can last a few weeks.  - azithromycin (ZITHROMAX) 250 MG tablet; 2 tabs day one then 1 tab daily for 4 days.  Dispense: 6 tablet; Refill: 0  Anxiety disorder, unspecified type  Stable. No changes in current management. Instructed about warning signs. F/U in 5-6 months,before if needed.  - ALPRAZolam (XANAX) 0.25 MG tablet; Take 1 tablet (0.25 mg total) by mouth at bedtime.  Dispense: 30 tablet; Refill: 3      -[name] was advised to return sooner than planned today if new concerns arise.       Tamantha Saline G. Martinique, MD  [facility]. [place] office.

## 2017-07-04 NOTE — Telephone Encounter (Signed)
Patient has follow-up appointment on 07/05/17 to discuss with PCP.

## 2017-07-05 ENCOUNTER — Ambulatory Visit (INDEPENDENT_AMBULATORY_CARE_PROVIDER_SITE_OTHER): Payer: Medicare Other | Admitting: Family Medicine

## 2017-07-05 ENCOUNTER — Encounter: Payer: Self-pay | Admitting: Family Medicine

## 2017-07-05 VITALS — BP 134/80 | HR 84 | Temp 98.3°F | Resp 12 | Ht 63.25 in | Wt 182.2 lb

## 2017-07-05 DIAGNOSIS — E039 Hypothyroidism, unspecified: Secondary | ICD-10-CM

## 2017-07-05 DIAGNOSIS — I10 Essential (primary) hypertension: Secondary | ICD-10-CM

## 2017-07-05 DIAGNOSIS — F419 Anxiety disorder, unspecified: Secondary | ICD-10-CM

## 2017-07-05 DIAGNOSIS — R0989 Other specified symptoms and signs involving the circulatory and respiratory systems: Secondary | ICD-10-CM | POA: Diagnosis not present

## 2017-07-05 DIAGNOSIS — J988 Other specified respiratory disorders: Secondary | ICD-10-CM | POA: Diagnosis not present

## 2017-07-05 DIAGNOSIS — E785 Hyperlipidemia, unspecified: Secondary | ICD-10-CM | POA: Diagnosis not present

## 2017-07-05 DIAGNOSIS — J989 Respiratory disorder, unspecified: Secondary | ICD-10-CM | POA: Insufficient documentation

## 2017-07-05 LAB — COMPREHENSIVE METABOLIC PANEL
ALT: 10 U/L (ref 0–35)
AST: 9 U/L (ref 0–37)
Albumin: 4.5 g/dL (ref 3.5–5.2)
Alkaline Phosphatase: 90 U/L (ref 39–117)
BUN: 25 mg/dL — AB (ref 6–23)
CHLORIDE: 102 meq/L (ref 96–112)
CO2: 24 mEq/L (ref 19–32)
Calcium: 10.3 mg/dL (ref 8.4–10.5)
Creatinine, Ser: 1.21 mg/dL — ABNORMAL HIGH (ref 0.40–1.20)
GFR: 46.6 mL/min — ABNORMAL LOW (ref >60.00)
GLUCOSE: 157 mg/dL — AB (ref 70–99)
POTASSIUM: 4.5 meq/L (ref 3.5–5.1)
SODIUM: 137 meq/L (ref 135–145)
Total Bilirubin: 0.4 mg/dL (ref 0.2–1.2)
Total Protein: 7.4 g/dL (ref 6.0–8.3)

## 2017-07-05 LAB — TSH: TSH: 0.67 u[IU]/mL (ref 0.35–4.50)

## 2017-07-05 MED ORDER — ALBUTEROL SULFATE (2.5 MG/3ML) 0.083% IN NEBU: 2.5000 mg | INHALATION_SOLUTION | Freq: Four times a day (QID) | RESPIRATORY_TRACT | 1 refills | Status: DC | PRN

## 2017-07-05 MED ORDER — AZITHROMYCIN 250 MG PO TABS: ORAL_TABLET | ORAL | 0 refills | Status: DC

## 2017-07-05 MED ORDER — PREDNISONE 20 MG PO TABS: 40.0000 mg | ORAL_TABLET | Freq: Every day | ORAL | 0 refills | Status: AC

## 2017-07-05 MED ORDER — ALPRAZOLAM 0.25 MG PO TABS: 0.2500 mg | ORAL_TABLET | Freq: Every day | ORAL | 3 refills | Status: DC

## 2017-07-05 NOTE — Patient Instructions (Signed)
A few things to remember from today's visit:   Hypothyroidism, unspecified type - Plan: TSH  Hypertension, essential, benign - Plan: Comprehensive metabolic panel  Hyperlipidemia, unspecified hyperlipidemia type - Plan: Comprehensive metabolic panel  Reactive airway disease that is not asthma - Plan: predniSONE (DELTASONE) 20 MG tablet, albuterol (PROVENTIL) (2.5 MG/3ML) 0.083% nebulizer solution  Respiratory tract infection - Plan: azithromycin (ZITHROMAX) 250 MG tablet  Albuterol neb every 8 hours for a week then as needed for wheezing or shortness of breath.    Please be sure medication list is accurate. If a new problem present, please set up appointment sooner than planned today.

## 2017-07-05 NOTE — Assessment & Plan Note (Signed)
Adequately controlled. No changes in current management. Low salt diet to continue. Eye exam recommended annually. F/U in 6 months, before if needed.  

## 2017-07-05 NOTE — Assessment & Plan Note (Signed)
She is not fasting today, so we will plan on checking lipid panel next visit. Continue Lipitor 10 mg and low fat diet.

## 2017-07-05 NOTE — Assessment & Plan Note (Signed)
No changes in current management, will follow labs done today and will give further recommendations accordingly.  

## 2017-07-06 ENCOUNTER — Telehealth: Payer: Self-pay | Admitting: *Deleted

## 2017-07-06 DIAGNOSIS — J988 Other specified respiratory disorders: Secondary | ICD-10-CM

## 2017-07-06 MED ORDER — AZITHROMYCIN 250 MG PO TABS: ORAL_TABLET | ORAL | 0 refills | Status: AC

## 2017-07-06 NOTE — Telephone Encounter (Signed)
Patient called in stating she did not receive the written rx for azithromycin at her appt yesterday.  Medication resent to pharmacy as requested.

## 2017-08-02 ENCOUNTER — Ambulatory Visit
Admission: RE | Admit: 2017-08-02 | Discharge: 2017-08-02 | Disposition: A | Discharge status: Home / Self Care | Payer: Medicare Other | Source: Ambulatory Visit | Attending: Otolaryngology | Admitting: Otolaryngology

## 2017-08-02 ENCOUNTER — Other Ambulatory Visit: Payer: Self-pay | Admitting: Otolaryngology

## 2017-08-02 DIAGNOSIS — R059 Cough, unspecified: Secondary | ICD-10-CM

## 2017-08-02 DIAGNOSIS — R05 Cough: Secondary | ICD-10-CM

## 2017-08-02 DIAGNOSIS — Z9002 Acquired absence of larynx: Secondary | ICD-10-CM | POA: Diagnosis not present

## 2017-08-02 DIAGNOSIS — H938X3 Other specified disorders of ear, bilateral: Secondary | ICD-10-CM | POA: Diagnosis not present

## 2017-08-02 DIAGNOSIS — Z930 Tracheostomy status: Secondary | ICD-10-CM | POA: Diagnosis not present

## 2017-08-02 DIAGNOSIS — Z87891 Personal history of nicotine dependence: Secondary | ICD-10-CM | POA: Diagnosis not present

## 2017-08-02 DIAGNOSIS — Z923 Personal history of irradiation: Secondary | ICD-10-CM | POA: Diagnosis not present

## 2017-08-02 DIAGNOSIS — Z8521 Personal history of malignant neoplasm of larynx: Secondary | ICD-10-CM | POA: Diagnosis not present

## 2017-08-22 DIAGNOSIS — M542 Cervicalgia: Secondary | ICD-10-CM | POA: Diagnosis not present

## 2017-08-22 DIAGNOSIS — Z79899 Other long term (current) drug therapy: Secondary | ICD-10-CM | POA: Diagnosis not present

## 2017-08-22 DIAGNOSIS — G894 Chronic pain syndrome: Secondary | ICD-10-CM | POA: Diagnosis not present

## 2017-08-22 DIAGNOSIS — Z79891 Long term (current) use of opiate analgesic: Secondary | ICD-10-CM | POA: Diagnosis not present

## 2017-08-22 DIAGNOSIS — J701 Chronic and other pulmonary manifestations due to radiation: Secondary | ICD-10-CM | POA: Diagnosis not present

## 2017-09-11 ENCOUNTER — Other Ambulatory Visit: Payer: Self-pay | Admitting: Family Medicine

## 2017-09-11 DIAGNOSIS — F419 Anxiety disorder, unspecified: Secondary | ICD-10-CM

## 2017-09-11 MED ORDER — DULOXETINE HCL 60 MG PO CPEP: 60.0000 mg | ORAL_CAPSULE | Freq: Every day | ORAL | 0 refills | Status: DC

## 2017-09-11 NOTE — Telephone Encounter (Signed)
Copied from [place] [phone]. Topic: Quick Communication - Rx Refill/Question >> Sep 11, 2017 10:20 AM Lennox Solders wrote: Medication: duloxetine 60mg  #90  Has the patient contacted their pharmacy? No  pt has a new Engineer, civil (consulting) [place] [address]

## 2017-09-20 ENCOUNTER — Telehealth: Payer: Self-pay | Admitting: Family Medicine

## 2017-09-20 DIAGNOSIS — J989 Respiratory disorder, unspecified: Secondary | ICD-10-CM

## 2017-09-20 DIAGNOSIS — I10 Essential (primary) hypertension: Secondary | ICD-10-CM

## 2017-09-20 DIAGNOSIS — R0989 Other specified symptoms and signs involving the circulatory and respiratory systems: Secondary | ICD-10-CM

## 2017-09-20 MED ORDER — CARVEDILOL 6.25 MG PO TABS: 6.2500 mg | ORAL_TABLET | Freq: Two times a day (BID) | ORAL | 2 refills | Status: DC

## 2017-09-20 MED ORDER — ALBUTEROL SULFATE (2.5 MG/3ML) 0.083% IN NEBU: 2.5000 mg | INHALATION_SOLUTION | Freq: Four times a day (QID) | RESPIRATORY_TRACT | 1 refills | Status: DC | PRN

## 2017-09-20 NOTE — Telephone Encounter (Signed)
Copied from [place] [phone]. Topic: Quick Communication - Rx Refill/Question >> Sep 20, 2017 12:52 PM Selinda Flavin B, NT wrote: Medication: carvedilol (COREG) 6.25 MG tablet  albuterol (PROVENTIL) (2.5 MG/3ML) 0.083% nebulizer solution  Has the patient contacted their pharmacy? Yes.   (Agent: If no, request that the patient contact the pharmacy for the refill.) (Agent: If yes, when and what did the pharmacy advise?)  Preferred Pharmacy (with phone number or street name): [facility], [place] - [address]  Agent: Please be advised that RX refills may take up to 3 business days. We ask that you follow-up with your pharmacy.

## 2017-09-21 ENCOUNTER — Other Ambulatory Visit: Payer: Self-pay | Admitting: Family Medicine

## 2017-09-21 DIAGNOSIS — J989 Respiratory disorder, unspecified: Secondary | ICD-10-CM

## 2017-09-21 DIAGNOSIS — R0989 Other specified symptoms and signs involving the circulatory and respiratory systems: Secondary | ICD-10-CM

## 2017-10-03 ENCOUNTER — Encounter: Payer: Self-pay | Admitting: Family Medicine

## 2017-10-03 ENCOUNTER — Ambulatory Visit (INDEPENDENT_AMBULATORY_CARE_PROVIDER_SITE_OTHER): Payer: Medicare Other | Admitting: Family Medicine

## 2017-10-03 VITALS — BP 125/84 | HR 78 | Temp 98.3°F | Resp 16 | Ht 63.25 in | Wt 174.5 lb

## 2017-10-03 DIAGNOSIS — I10 Essential (primary) hypertension: Secondary | ICD-10-CM

## 2017-10-03 DIAGNOSIS — E785 Hyperlipidemia, unspecified: Secondary | ICD-10-CM

## 2017-10-03 DIAGNOSIS — E039 Hypothyroidism, unspecified: Secondary | ICD-10-CM

## 2017-10-03 DIAGNOSIS — G4700 Insomnia, unspecified: Secondary | ICD-10-CM | POA: Diagnosis not present

## 2017-10-03 DIAGNOSIS — F419 Anxiety disorder, unspecified: Secondary | ICD-10-CM | POA: Diagnosis not present

## 2017-10-03 DIAGNOSIS — J989 Respiratory disorder, unspecified: Secondary | ICD-10-CM

## 2017-10-03 DIAGNOSIS — R0989 Other specified symptoms and signs involving the circulatory and respiratory systems: Secondary | ICD-10-CM

## 2017-10-03 MED ORDER — DOXEPIN HCL 10 MG/ML PO CONC: 5.0000 mg | Freq: Every day | ORAL | 1 refills | Status: DC

## 2017-10-03 MED ORDER — AMLODIPINE BESYLATE 10 MG PO TABS: 10.0000 mg | ORAL_TABLET | Freq: Every day | ORAL | 1 refills | Status: DC

## 2017-10-03 MED ORDER — LEVOTHYROXINE SODIUM 88 MCG PO TABS
88.0000 ug | ORAL_TABLET | Freq: Every day | ORAL | 2 refills | Status: DC
Start: 2017-10-03 — End: 2019-05-22

## 2017-10-03 MED ORDER — LOSARTAN POTASSIUM 50 MG PO TABS: 50.0000 mg | ORAL_TABLET | Freq: Every day | ORAL | 1 refills | Status: DC

## 2017-10-03 MED ORDER — ALBUTEROL SULFATE (2.5 MG/3ML) 0.083% IN NEBU: INHALATION_SOLUTION | RESPIRATORY_TRACT | 1 refills | Status: AC

## 2017-10-03 MED ORDER — DULOXETINE HCL 60 MG PO CPEP: 60.0000 mg | ORAL_CAPSULE | Freq: Every day | ORAL | 1 refills | Status: DC

## 2017-10-03 MED ORDER — ATORVASTATIN CALCIUM 10 MG PO TABS
10.0000 mg | ORAL_TABLET | Freq: Every day | ORAL | 1 refills | Status: DC
Start: 2017-10-03 — End: 2019-05-22

## 2017-10-03 MED ORDER — CARVEDILOL 6.25 MG PO TABS
6.2500 mg | ORAL_TABLET | Freq: Two times a day (BID) | ORAL | 1 refills | Status: DC
Start: 2017-10-03 — End: 2018-01-23

## 2017-10-03 NOTE — Patient Instructions (Addendum)
A few things to remember from today's visit:   Insomnia, unspecified type - Plan: doxepin (SINEQUAN) 10 MG/ML solution  Anxiety disorder, unspecified type - Plan: doxepin (SINEQUAN) 10 MG/ML solution, DULoxetine (CYMBALTA) 60 MG capsule  Hypertension, essential, benign - Plan: losartan (COZAAR) 50 MG tablet, carvedilol (COREG) 6.25 MG tablet, amLODipine (NORVASC) 10 MG tablet  Hypothyroidism, unspecified type - Plan: levothyroxine (SYNTHROID, LEVOTHROID) 88 MCG tablet  Hyperlipidemia, unspecified hyperlipidemia type - Plan: atorvastatin (LIPITOR) 10 MG tablet  Doxepin 5 ml at bed time,can increase to 10 ml and let me know in 2 weeks how you are doing with this med.   Please be sure medication list is accurate. If a new problem present, please set up appointment sooner than planned today.

## 2017-10-03 NOTE — Progress Notes (Signed)
ACUTE VISIT   HPI:  Chief Complaint  Patient presents with  . Medication Dose Change    wants Xanax changed to a larger dose due to having more anxiety and not being able to sleep    Erika Preston is a 72 y.o. female, who is here today with her daughter complaining of worsening anxiety and difficulty falling asleep and staying asleep.  Sleeping about 4 hours. + Fatigue. Xanax used to help with her sleep. She has taken OTC sleep aids but have not helped.  She has not tried other prescription treatments in the past.   History of anxiety, chronic pain, depression.   Currently she is on Cymbalta 60 mg daily, which helps with mood and neuropathy.  She is on Xanax 0.25 mg daily as needed. She follows with pain clinic. She has not been able to identified exacerbating factors. Episodes are alleviated some by Xanax, she thinks she needs a higher dose. There is not a specific event that could have worsened anxiety.  She denies suicidal thoughts.  She has not noted side effects with medications.    Last office visit, 07/05/2017, she was complaining of wheezing and thick mucus around tracheostomy. Albuterol nebulization treatments was started, she is reporting improvement of symptoms. She follows with ENT periodically.   Planning on moving to [place], [place] at the end of this month, she needs refills for most of her medications.  Review of Systems  Constitutional: Positive for fatigue. Negative for activity change and appetite change.  Respiratory: Positive for cough. Negative for shortness of breath and wheezing.   Cardiovascular: Negative for chest pain and palpitations.  Gastrointestinal: Negative for abdominal pain, diarrhea, nausea and vomiting.  Neurological: Negative for weakness and headaches.  Psychiatric/Behavioral: Positive for sleep disturbance. Negative for confusion, hallucinations and suicidal ideas. The patient is nervous/anxious.      Current  Outpatient Medications on File Prior to Visit  Medication Sig Dispense Refill  . ALPRAZolam (XANAX) 0.25 MG tablet Take 1 tablet (0.25 mg total) by mouth at bedtime. 30 tablet 3  . oxyCODONE-acetaminophen (PERCOCET) 10-325 MG tablet 1 tablet up to Four Times A Day as needed  0  . sodium chloride 0.9 % nebulizer solution Take 3 mLs as needed by nebulization (through trach). 90 mL 6  . VITAMIN E PO Take 1 tablet by mouth daily.     No current facility-administered medications on file prior to visit.      Past Medical History:  Diagnosis Date  . Anxiety   . Cancer (HCC)    laryngeal  . Hyperlipidemia   . Hypertension   . Thyroid disease    No Known Allergies  Social History   Socioeconomic History  . Marital status: Divorced    Spouse name: Not on file  . Number of children: Not on file  . Years of education: Not on file  . Highest education level: Not on file  Occupational History  . Not on file  Social Needs  . Financial resource strain: Not on file  . Food insecurity:    Worry: Not on file    Inability: Not on file  . Transportation needs:    Medical: Not on file    Non-medical: Not on file  Tobacco Use  . Smoking status: Former Research scientist (life sciences)  . Smokeless tobacco: Never Used  Substance and Sexual Activity  . Alcohol use: Yes  . Drug use: No  . Sexual activity: Not Currently  Lifestyle  .  Physical activity:    Days per week: Not on file    Minutes per session: Not on file  . Stress: Not on file  Relationships  . Social connections:    Talks on phone: Not on file    Gets together: Not on file    Attends religious service: Not on file    Active member of club or organization: Not on file    Attends meetings of clubs or organizations: Not on file    Relationship status: Not on file  Other Topics Concern  . Not on file  Social History Narrative  . Not on file    Vitals:   10/03/17 1531  BP: 125/84  Pulse: 78  Resp: 16  Temp: 98.3 F (36.8 C)  SpO2: 94%    Body mass index is 30.67 kg/m.   Physical Exam  Nursing note and vitals reviewed. Constitutional: She is oriented to person, place, and time. She appears well-developed. No distress.  HENT:  Head: Normocephalic.  Mouth/Throat: Oropharynx is clear and moist and mucous membranes are normal.  Eyes: Conjunctivae are normal.  Neck:  Tracheotomy clean.  Cardiovascular: Normal rate and regular rhythm.  No murmur heard. Respiratory: Effort normal and breath sounds normal. No respiratory distress.  Musculoskeletal: She exhibits no edema.  Neurological: She is alert and oriented to person, place, and time. She has normal strength. Gait normal.  Skin: Skin is warm. No erythema.  Psychiatric: Her mood appears anxious. Cognition and memory are normal. She expresses no suicidal ideation. She expresses no suicidal plans.  Well groomed, good eye contact.    ASSESSMENT AND PLAN:  [name] was seen today for medication dose change.  Diagnoses and all orders for this visit:  Insomnia, unspecified type  Good sleep hygiene. Pharmacologic options discussed. Explained that Xanax is not recommended to treat insomnia. She agrees with trying Doxepin , she will start with 5 mg and can increase dose to 10 mg if needed. Some side effects discussed. She was instructed to let me know in 3 weeks how she is doing with new medication.  -     doxepin (SINEQUAN) 10 MG/ML solution; Take 0.5 mLs (5 mg total) by mouth at bedtime.  Anxiety disorder, unspecified type  Getting worse. For now no changes in Xanax,side effects discussed. Continue Cymbalta 60 mg daily. Doxepin may help. Instructed about warning signs.   -     doxepin (SINEQUAN) 10 MG/ML solution; Take 0.5 mLs (5 mg total) by mouth at bedtime. -     DULoxetine (CYMBALTA) 60 MG capsule; Take 1 capsule (60 mg total) by mouth daily.  Reactive airway disease that is not asthma  Albuterol neb treatments have helped, she will continue  current treatment. Follow-up in 3 to 4 months with new PCP.     Return in about 3 months (around 01/03/2018) for with new PCP.      Erika Suleiman G. Martinique, MD  [facility]. [place] office.

## 2017-10-20 DIAGNOSIS — M542 Cervicalgia: Secondary | ICD-10-CM | POA: Diagnosis not present

## 2017-10-20 DIAGNOSIS — G894 Chronic pain syndrome: Secondary | ICD-10-CM | POA: Diagnosis not present

## 2017-10-20 DIAGNOSIS — Z79891 Long term (current) use of opiate analgesic: Secondary | ICD-10-CM | POA: Diagnosis not present

## 2017-10-20 DIAGNOSIS — Z79899 Other long term (current) drug therapy: Secondary | ICD-10-CM | POA: Diagnosis not present

## 2017-10-20 DIAGNOSIS — J701 Chronic and other pulmonary manifestations due to radiation: Secondary | ICD-10-CM | POA: Diagnosis not present

## 2017-10-23 ENCOUNTER — Telehealth: Payer: Self-pay | Admitting: Family Medicine

## 2017-10-23 NOTE — Telephone Encounter (Signed)
Sent to Dr. Martinique as Juluis Rainier

## 2017-10-23 NOTE — Telephone Encounter (Signed)
Copied from [place] [id number]. Topic: Quick Communication - See Telephone Encounter >> Oct 23, 2017  1:56 PM Bea Graff, NT wrote: CRM for notification. See Telephone encounter for: 10/23/17. Pt calling and states that the doxepin (SINEQUAN) 10 MG/ML solution is working great and she is taking 0.86ml at night.

## 2017-10-24 NOTE — Telephone Encounter (Signed)
Copied from [place] [phone]. Topic: Quick Communication - See Telephone Encounter >> Oct 24, 2017  1:10 PM Carolyn Stare wrote:   Pt since the med is working fine she is asking for a refill   doxepin

## 2017-10-25 NOTE — Telephone Encounter (Signed)
I believe she has a refill available. Rx was sent 10/03/17 30 ml with one refill.  Thanks, BJ

## 2017-10-25 NOTE — Telephone Encounter (Signed)
Message sent to Dr. Jordan for review and approval. 

## 2017-10-30 NOTE — Telephone Encounter (Signed)
Message sent to Dr. Jordan for approval. 

## 2017-10-30 NOTE — Telephone Encounter (Signed)
With patient taking 0.35ml, its to early for refill. Patient now out of town, requesting refill be sent to [facility], [place]

## 2017-11-01 ENCOUNTER — Other Ambulatory Visit: Payer: Self-pay | Admitting: Family Medicine

## 2017-11-01 DIAGNOSIS — F419 Anxiety disorder, unspecified: Secondary | ICD-10-CM

## 2017-11-01 DIAGNOSIS — G4700 Insomnia, unspecified: Secondary | ICD-10-CM

## 2017-11-01 MED ORDER — DOXEPIN HCL 10 MG/ML PO CONC: 8.0000 mg | Freq: Every day | ORAL | 1 refills | Status: DC

## 2017-11-01 NOTE — Telephone Encounter (Signed)
Prescription for doxepin was sent to pharmacy she requested.  Thanks, BJ

## 2017-11-03 NOTE — Telephone Encounter (Signed)
Left message informing patient that medication was sent to her pharmacy.

## 2017-12-03 DIAGNOSIS — S99922A Unspecified injury of left foot, initial encounter: Secondary | ICD-10-CM | POA: Diagnosis not present

## 2017-12-03 DIAGNOSIS — M79672 Pain in left foot: Secondary | ICD-10-CM | POA: Diagnosis not present

## 2017-12-03 DIAGNOSIS — S9032XA Contusion of left foot, initial encounter: Secondary | ICD-10-CM | POA: Diagnosis not present

## 2017-12-03 DIAGNOSIS — G8911 Acute pain due to trauma: Secondary | ICD-10-CM | POA: Diagnosis not present

## 2017-12-05 ENCOUNTER — Telehealth: Payer: Self-pay | Admitting: Family Medicine

## 2017-12-05 NOTE — Telephone Encounter (Signed)
Copied from [place] [id number]. Topic: Quick Communication - Rx Refill/Question >> Dec 05, 2017 10:39 AM Keene Breath wrote: Medication: ALPRAZolam Duanne Moron) 0.25 MG tablet  Patient called to request a refill for the above medication.  CB# [phone]  Preferred Pharmacy (with phone number or street name): [facility] [address], [place] [phone] (Phone) [phone] (Fax

## 2017-12-05 NOTE — Telephone Encounter (Signed)
Incoming call requesting refill for Xanax .25   LOV:  10/03/17 with Betty Martinique, MD   Last refill:  07/05/17   Pharmacy:  [facility]. [place]

## 2017-12-05 NOTE — Telephone Encounter (Signed)
Message sent to Dr. Jordan for review and approval. 

## 2017-12-06 NOTE — Telephone Encounter (Signed)
Last refill 10/20/17, according to [place] controlled substance report. It seems like she may have another refill available at her pharmacy. Please contact pharmacy to verify. Thanks, BJ

## 2017-12-08 ENCOUNTER — Other Ambulatory Visit: Payer: Self-pay | Admitting: Family Medicine

## 2017-12-08 DIAGNOSIS — F419 Anxiety disorder, unspecified: Secondary | ICD-10-CM

## 2017-12-08 MED ORDER — ALPRAZOLAM 0.25 MG PO TABS: 0.2500 mg | ORAL_TABLET | Freq: Every day | ORAL | 0 refills | Status: DC

## 2017-12-08 NOTE — Telephone Encounter (Signed)
Last prescription for Xanax 0.25 mg to continue taking once daily prn was sent to her pharmacy. She needs to establish with new PCP.  Thanks, BJ

## 2017-12-08 NOTE — Telephone Encounter (Signed)
Spoke with [facility] in [place] and they had no record of patient refilling medication there and gave me the number to [facility] in [place] where it last showed that patient had a refill. Spoke with pharmacy and she stated that last refill was 10/20/17 with no more refills. [place], [place]

## 2017-12-13 DIAGNOSIS — F419 Anxiety disorder, unspecified: Secondary | ICD-10-CM | POA: Diagnosis not present

## 2017-12-13 DIAGNOSIS — G8928 Other chronic postprocedural pain: Secondary | ICD-10-CM | POA: Diagnosis not present

## 2017-12-13 DIAGNOSIS — I10 Essential (primary) hypertension: Secondary | ICD-10-CM | POA: Diagnosis not present

## 2017-12-13 DIAGNOSIS — E039 Hypothyroidism, unspecified: Secondary | ICD-10-CM | POA: Diagnosis not present

## 2018-01-12 DIAGNOSIS — Z87891 Personal history of nicotine dependence: Secondary | ICD-10-CM | POA: Diagnosis not present

## 2018-01-12 DIAGNOSIS — Z79899 Other long term (current) drug therapy: Secondary | ICD-10-CM | POA: Diagnosis not present

## 2018-01-12 DIAGNOSIS — F419 Anxiety disorder, unspecified: Secondary | ICD-10-CM | POA: Diagnosis not present

## 2018-01-12 DIAGNOSIS — I10 Essential (primary) hypertension: Secondary | ICD-10-CM | POA: Diagnosis not present

## 2018-01-12 DIAGNOSIS — Z9002 Acquired absence of larynx: Secondary | ICD-10-CM | POA: Diagnosis not present

## 2018-01-12 DIAGNOSIS — Z8521 Personal history of malignant neoplasm of larynx: Secondary | ICD-10-CM | POA: Diagnosis not present

## 2018-01-12 DIAGNOSIS — T18128A Food in esophagus causing other injury, initial encounter: Secondary | ICD-10-CM | POA: Diagnosis not present

## 2018-01-12 DIAGNOSIS — K222 Esophageal obstruction: Secondary | ICD-10-CM | POA: Diagnosis not present

## 2018-01-16 DIAGNOSIS — M542 Cervicalgia: Secondary | ICD-10-CM | POA: Diagnosis not present

## 2018-01-16 DIAGNOSIS — Z5181 Encounter for therapeutic drug level monitoring: Secondary | ICD-10-CM | POA: Diagnosis not present

## 2018-01-16 DIAGNOSIS — Z79891 Long term (current) use of opiate analgesic: Secondary | ICD-10-CM | POA: Diagnosis not present

## 2018-01-22 DIAGNOSIS — G894 Chronic pain syndrome: Secondary | ICD-10-CM | POA: Diagnosis not present

## 2018-01-22 DIAGNOSIS — M542 Cervicalgia: Secondary | ICD-10-CM | POA: Diagnosis not present

## 2018-01-22 DIAGNOSIS — Z79891 Long term (current) use of opiate analgesic: Secondary | ICD-10-CM | POA: Diagnosis not present

## 2018-01-22 DIAGNOSIS — J701 Chronic and other pulmonary manifestations due to radiation: Secondary | ICD-10-CM | POA: Diagnosis not present

## 2018-01-22 DIAGNOSIS — Z79899 Other long term (current) drug therapy: Secondary | ICD-10-CM | POA: Diagnosis not present

## 2018-01-23 ENCOUNTER — Ambulatory Visit (INDEPENDENT_AMBULATORY_CARE_PROVIDER_SITE_OTHER): Payer: Medicare Other | Admitting: Family Medicine

## 2018-01-23 ENCOUNTER — Encounter: Payer: Self-pay | Admitting: Family Medicine

## 2018-01-23 VITALS — BP 124/78 | HR 76 | Temp 98.2°F | Ht 63.25 in | Wt 172.5 lb

## 2018-01-23 DIAGNOSIS — K929 Disease of digestive system, unspecified: Secondary | ICD-10-CM

## 2018-01-23 DIAGNOSIS — I10 Essential (primary) hypertension: Secondary | ICD-10-CM

## 2018-01-23 DIAGNOSIS — G4700 Insomnia, unspecified: Secondary | ICD-10-CM | POA: Diagnosis not present

## 2018-01-23 DIAGNOSIS — F419 Anxiety disorder, unspecified: Secondary | ICD-10-CM | POA: Diagnosis not present

## 2018-01-23 DIAGNOSIS — Z23 Encounter for immunization: Secondary | ICD-10-CM

## 2018-01-23 MED ORDER — DULOXETINE HCL 60 MG PO CPEP: 60.0000 mg | ORAL_CAPSULE | Freq: Every day | ORAL | 2 refills | Status: DC

## 2018-01-23 MED ORDER — DOXEPIN HCL 10 MG PO CAPS: 10.0000 mg | ORAL_CAPSULE | Freq: Every evening | ORAL | 1 refills | Status: DC | PRN

## 2018-01-23 MED ORDER — LOSARTAN POTASSIUM 50 MG PO TABS
50.0000 mg | ORAL_TABLET | Freq: Every day | ORAL | 2 refills | Status: DC
Start: 2018-01-23 — End: 2019-03-18

## 2018-01-23 MED ORDER — CARVEDILOL 6.25 MG PO TABS: 6.2500 mg | ORAL_TABLET | Freq: Two times a day (BID) | ORAL | 2 refills | Status: DC

## 2018-01-23 MED ORDER — ALPRAZOLAM 0.25 MG PO TABS: 0.2500 mg | ORAL_TABLET | Freq: Every day | ORAL | 3 refills | Status: DC

## 2018-01-23 MED ORDER — AMLODIPINE BESYLATE 10 MG PO TABS: 10.0000 mg | ORAL_TABLET | Freq: Every day | ORAL | 2 refills | Status: DC

## 2018-01-23 NOTE — Progress Notes (Signed)
HPI:   [name] is a 72 y.o. female, who is here today with her daughter for follow up.   She  was last seen on 10/03/17, she moved out of town and was supposed to establish with new PCP in [place]. Tracheostomy, so her daughter helps with interrogation.  According to daughter,she could not find one she likes and she decided to continue health care with me. She is also following with pain clinic,she is coming to [place] every 3 months.  Since her last OV she has been in the ED x 2 food impaction of esophagus and left foot concussion. She has followed with GI.    Anxiety and depression, she is on Cymbalta 60 mg daily. Also taking Xanax 0.25 mg daily as needed. Symptoms have been well controlled. No side effects reported. Denies suicidal thoughts.  -She is still using Albuterol neb at least once daily. It helps with wheezing and tick mucus around tracheostomy. Denies chest pain or dyspnea. Occasionally productive cough. Denies hemoptysis.   HTN: She is on Losartan 50 mg daily,Coreg 6.25 mg bid,and Amlodipine 10 mg daily.  Denies severe/frequent headache, visual changes,palpitation, claudication, focal weakness, or edema. Lab Results  Component Value Date   CREATININE 1.21 (H) 07/05/2017   BUN 25 (H) 07/05/2017   NA 137 07/05/2017   K 4.5 07/05/2017   CL 102 07/05/2017   CO2 24 07/05/2017   Insomnia: She is on Doxepin 10 mg daily,she is taking liquid and would like to change to cap. Sleeping well, 8 hours. No side effects from medication.    Review of Systems  Constitutional: Positive for fatigue. Negative for activity change, appetite change and fever.  HENT: Negative for mouth sores, nosebleeds and trouble swallowing.   Eyes: Negative for redness and visual disturbance.  Respiratory: Positive for wheezing. Negative for cough and shortness of breath.   Cardiovascular: Negative for chest pain, palpitations and leg swelling.    Gastrointestinal: Negative for abdominal pain, nausea and vomiting.       Negative for changes in bowel habits.  Genitourinary: Negative for decreased urine volume, dysuria and hematuria.  Neurological: Negative for syncope, weakness and headaches.  Psychiatric/Behavioral: Positive for sleep disturbance. The patient is nervous/anxious.      Current Outpatient Medications on File Prior to Visit  Medication Sig Dispense Refill  . albuterol (PROVENTIL) (2.5 MG/3ML) 0.083% nebulizer solution USE 1 VIAL IN NEBULIZER EVERY 6 HOURS AS NEEDED FOR WHEEZING OR SHORTNESS OF BREATH 150 mL 1  . atorvastatin (LIPITOR) 10 MG tablet Take 1 tablet (10 mg total) by mouth daily. 90 tablet 1  . levothyroxine (SYNTHROID, LEVOTHROID) 88 MCG tablet Take 1 tablet (88 mcg total) by mouth daily. 90 tablet 2  . oxyCODONE-acetaminophen (PERCOCET) 10-325 MG tablet 1 tablet up to Four Times A Day as needed  0  . sodium chloride 0.9 % nebulizer solution Take 3 mLs as needed by nebulization (through trach). 90 mL 6  . VITAMIN E PO Take 1 tablet by mouth daily.     No current facility-administered medications on file prior to visit.      Past Medical History:  Diagnosis Date  . Anxiety   . Cancer (HCC)    laryngeal  . Hyperlipidemia   . Hypertension   . Thyroid disease    No Known Allergies  Social History   Socioeconomic History  . Marital status: Divorced    Spouse name: Not on file  . Number of children:  Not on file  . Years of education: Not on file  . Highest education level: Not on file  Occupational History  . Not on file  Social Needs  . Financial resource strain: Not on file  . Food insecurity:    Worry: Not on file    Inability: Not on file  . Transportation needs:    Medical: Not on file    Non-medical: Not on file  Tobacco Use  . Smoking status: Former Research scientist (life sciences)  . Smokeless tobacco: Never Used  Substance and Sexual Activity  . Alcohol use: Yes  . Drug use: No  . Sexual activity:  Not Currently  Lifestyle  . Physical activity:    Days per week: Not on file    Minutes per session: Not on file  . Stress: Not on file  Relationships  . Social connections:    Talks on phone: Not on file    Gets together: Not on file    Attends religious service: Not on file    Active member of club or organization: Not on file    Attends meetings of clubs or organizations: Not on file    Relationship status: Not on file  Other Topics Concern  . Not on file  Social History Narrative  . Not on file    Vitals:   01/23/18 1152  BP: 124/78  Pulse: 76  Temp: 98.2 F (36.8 C)  SpO2: 95%   Body mass index is 30.32 kg/m.      Physical Exam  Nursing note and vitals reviewed. Constitutional: She is oriented to person, place, and time. She appears well-developed. No distress.  HENT:  Head: Normocephalic and atraumatic.  Mouth/Throat: Oropharynx is clear and moist and mucous membranes are normal.  Eyes: Pupils are equal, round, and reactive to light. Conjunctivae are normal.  Neck:  Tracheostomy clean with no surrounding erythema.  Cardiovascular: Normal rate and regular rhythm.  No murmur heard. Pulses:      Dorsalis pedis pulses are 2+ on the right side, and 2+ on the left side.  Respiratory: Effort normal and breath sounds normal. No respiratory distress.  Prolonged expiration.  GI: Soft. She exhibits no mass. There is no hepatomegaly. There is no tenderness.  Musculoskeletal: She exhibits no edema.  Lymphadenopathy:    She has no cervical adenopathy.  Neurological: She is alert and oriented to person, place, and time. She has normal strength. No cranial nerve deficit. Gait normal.  Skin: Skin is warm. No rash noted. No erythema.  Psychiatric: She has a normal mood and affect.  Well groomed, good eye contact.       ASSESSMENT AND PLAN:   [name] was seen today for follow-up.  Orders Placed This Encounter  Procedures  . Flu vaccine HIGH DOSE  PF   Hypertension, essential, benign  Adequately controlled. No changes in current management. Low salt diet recommended. Eye exam recommended annually. F/U in 6 months, before if needed.  - losartan (COZAAR) 50 MG tablet; Take 1 tablet (50 mg total) by mouth daily.  Dispense: 90 tablet; Refill: 2 - amLODipine (NORVASC) 10 MG tablet; Take 1 tablet (10 mg total) by mouth daily.  Dispense: 90 tablet; Refill: 2 - carvedilol (COREG) 6.25 MG tablet; Take 1 tablet (6.25 mg total) by mouth 2 (two) times daily.  Dispense: 180 tablet; Refill: 2  Reactive airway disease that is not asthma  Albuterol is helping. Continue Albuterol neb tid prn to continue. Side effects discussed.  Anxiety disorder, unspecified type  Stable. No changes in Cymbalta or Xanax. Side effects discussed. F/U in 6 months,before if needed.  - DULoxetine (CYMBALTA) 60 MG capsule; Take 1 capsule (60 mg total) by mouth daily.  Dispense: 90 capsule; Refill: 2 - ALPRAZolam (XANAX) 0.25 MG tablet; Take 1 tablet (0.25 mg total) by mouth at bedtime.  Dispense: 30 tablet; Refill: 3  Insomnia, unspecified type  Well controlled. No changes in Doxepin, will change liquid to cap. Good sleep hygiene. F/U in 6 months. - doxepin (SINEQUAN) 10 MG capsule; Take 1 capsule (10 mg total) by mouth at bedtime as needed.  Dispense: 90 capsule; Refill: 1  Encounter for immunization  - Flu vaccine HIGH DOSE PF      Erika Zollinger G. Martinique, MD  [facility] [place] office.

## 2018-01-23 NOTE — Patient Instructions (Addendum)
A few things to remember from today's visit:   Insomnia, unspecified type - Plan: doxepin (SINEQUAN) 10 MG capsule  Hypertension, essential, benign - Plan: losartan (COZAAR) 50 MG tablet, amLODipine (NORVASC) 10 MG tablet, carvedilol (COREG) 6.25 MG tablet  Reactive airway disease that is not asthma  Anxiety disorder, unspecified type - Plan: DULoxetine (CYMBALTA) 60 MG capsule  No changes today. I will see you back next year in February or March.   Please be sure medication list is accurate. If a new problem present, please set up appointment sooner than planned today.

## 2018-01-26 ENCOUNTER — Encounter: Payer: Self-pay | Admitting: Family Medicine

## 2018-03-21 DIAGNOSIS — M542 Cervicalgia: Secondary | ICD-10-CM | POA: Diagnosis not present

## 2018-03-21 DIAGNOSIS — G894 Chronic pain syndrome: Secondary | ICD-10-CM | POA: Diagnosis not present

## 2018-03-21 DIAGNOSIS — Z79899 Other long term (current) drug therapy: Secondary | ICD-10-CM | POA: Diagnosis not present

## 2018-03-21 DIAGNOSIS — J701 Chronic and other pulmonary manifestations due to radiation: Secondary | ICD-10-CM | POA: Diagnosis not present

## 2018-03-21 DIAGNOSIS — Z79891 Long term (current) use of opiate analgesic: Secondary | ICD-10-CM | POA: Diagnosis not present

## 2018-04-29 IMAGING — CR DG CHEST 2V
2 series · 2 of 2 positions shown · non-contrast
Comparison: None.

CLINICAL DATA: Cough for 1 week

EXAM:
CHEST - 2 VIEW

[w chest pa]
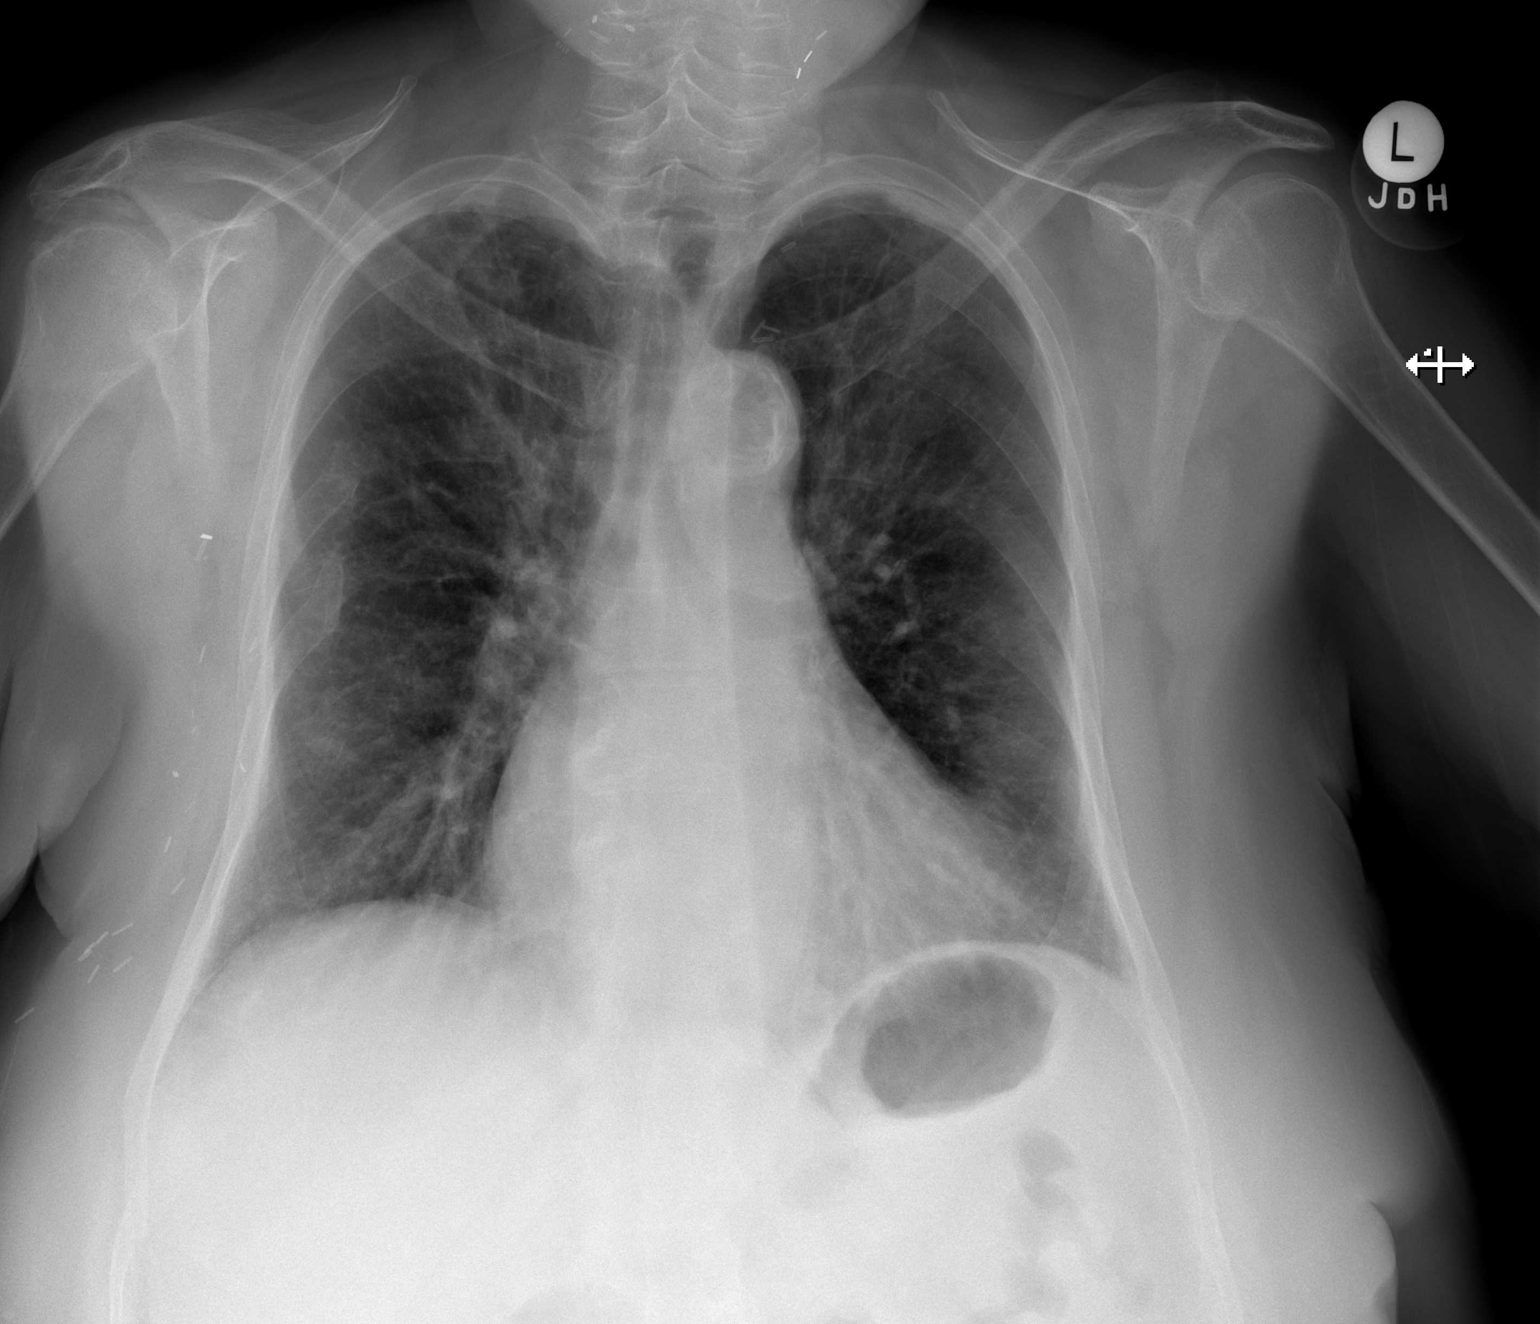

[w chest lat]
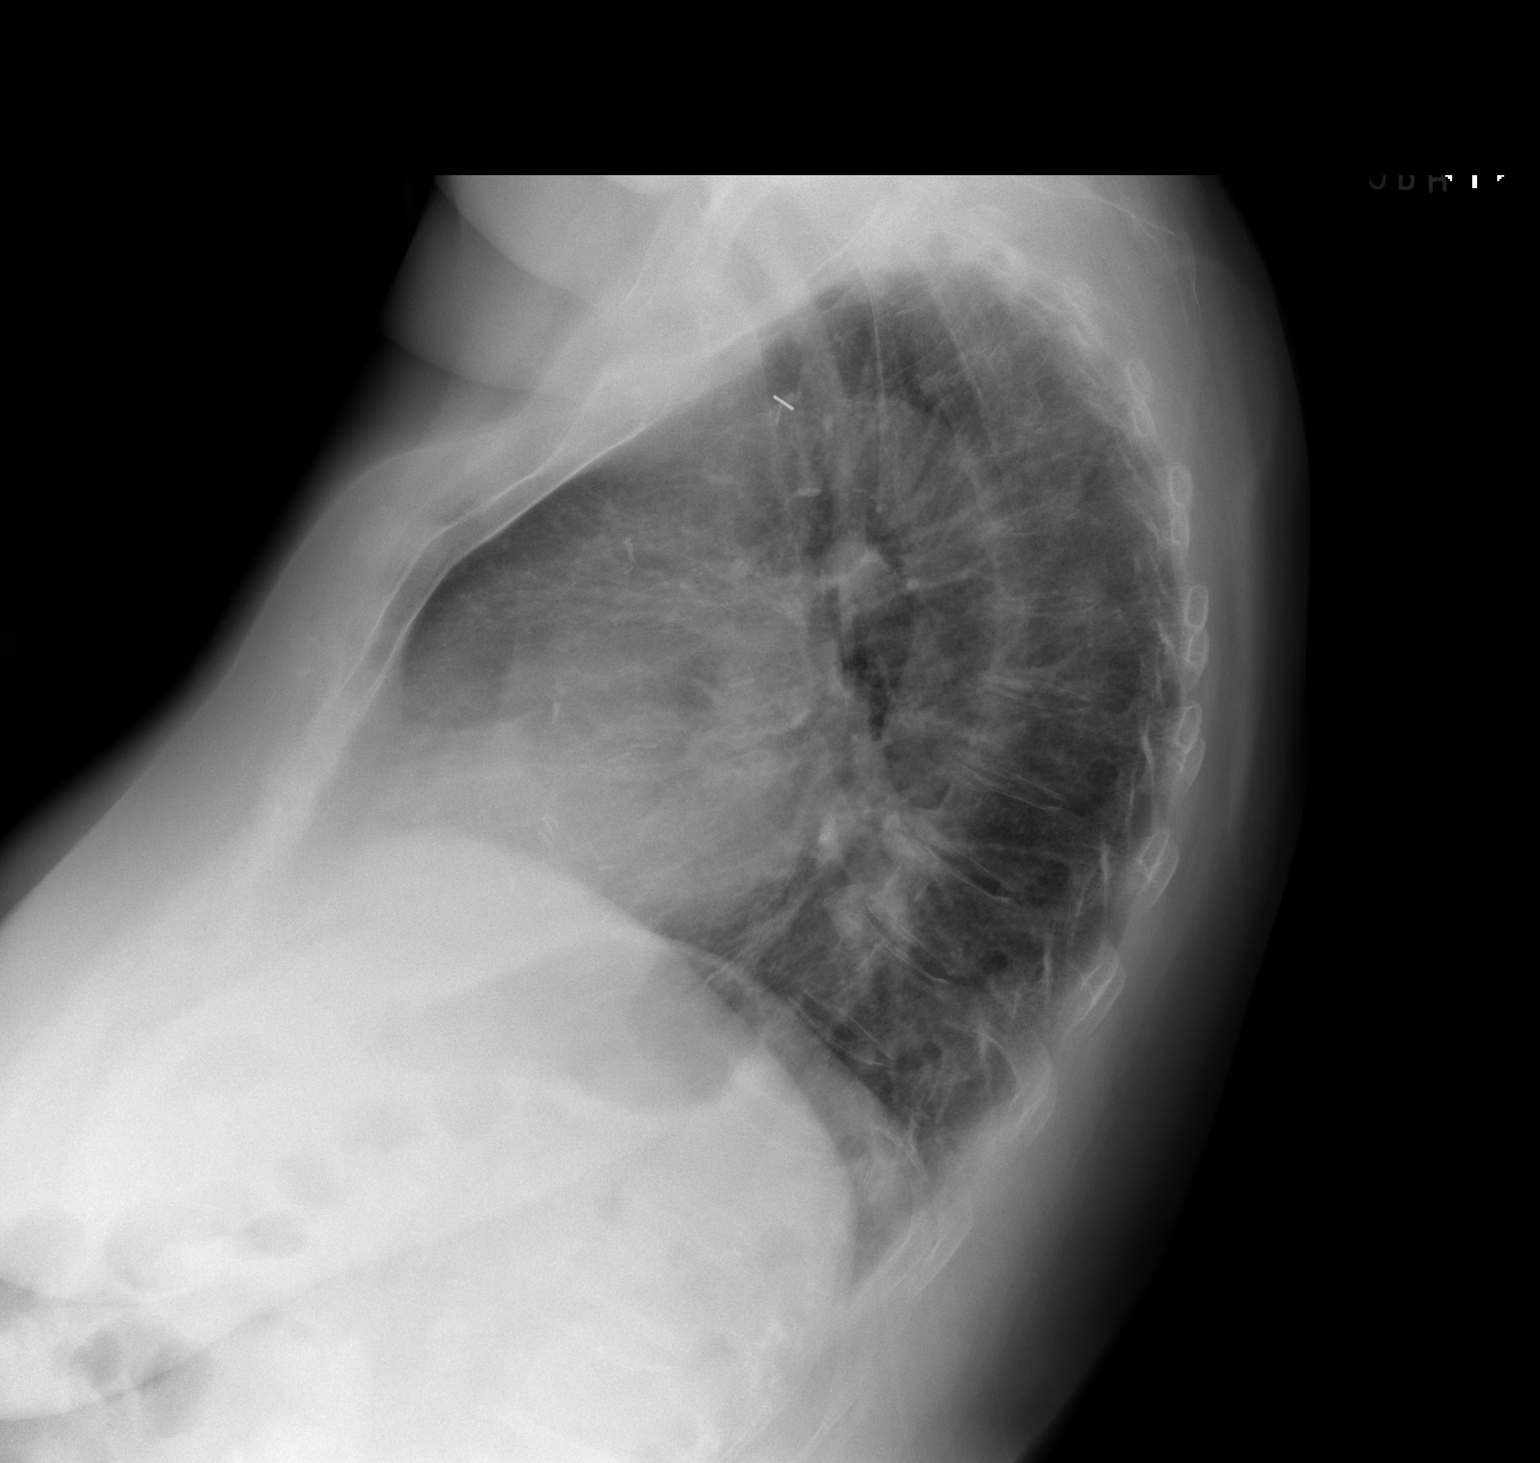

[2 of 2 positions shown; findings below may reference images not displayed]

FINDINGS: There is no focal parenchymal opacity. There is no pleural effusion
or pneumothorax. The heart and mediastinal contours are
unremarkable. There is thoracic aortic atherosclerosis.

There is old posttraumatic deformity of the right posterior chest
wall.
IMPRESSION: No active cardiopulmonary disease.

## 2018-05-29 ENCOUNTER — Ambulatory Visit (INDEPENDENT_AMBULATORY_CARE_PROVIDER_SITE_OTHER): Payer: Medicare Other | Admitting: Family Medicine

## 2018-05-29 ENCOUNTER — Encounter: Payer: Self-pay | Admitting: Family Medicine

## 2018-05-29 VITALS — BP 122/74 | HR 78 | Temp 98.3°F | Resp 12 | Ht 63.25 in | Wt 172.5 lb

## 2018-05-29 DIAGNOSIS — F419 Anxiety disorder, unspecified: Secondary | ICD-10-CM | POA: Diagnosis not present

## 2018-05-29 DIAGNOSIS — G4700 Insomnia, unspecified: Secondary | ICD-10-CM

## 2018-05-29 DIAGNOSIS — M542 Cervicalgia: Secondary | ICD-10-CM | POA: Diagnosis not present

## 2018-05-29 DIAGNOSIS — Z79899 Other long term (current) drug therapy: Secondary | ICD-10-CM | POA: Diagnosis not present

## 2018-05-29 DIAGNOSIS — J701 Chronic and other pulmonary manifestations due to radiation: Secondary | ICD-10-CM | POA: Diagnosis not present

## 2018-05-29 DIAGNOSIS — N183 Chronic kidney disease, stage 3 unspecified: Secondary | ICD-10-CM

## 2018-05-29 DIAGNOSIS — G894 Chronic pain syndrome: Secondary | ICD-10-CM | POA: Diagnosis not present

## 2018-05-29 DIAGNOSIS — M25571 Pain in right ankle and joints of right foot: Secondary | ICD-10-CM | POA: Diagnosis not present

## 2018-05-29 DIAGNOSIS — I10 Essential (primary) hypertension: Secondary | ICD-10-CM | POA: Diagnosis not present

## 2018-05-29 DIAGNOSIS — Z79891 Long term (current) use of opiate analgesic: Secondary | ICD-10-CM | POA: Diagnosis not present

## 2018-05-29 NOTE — Patient Instructions (Signed)
A few things to remember from today's visit:   Hypertension, essential, benign - Plan: Basic metabolic panel  Anxiety disorder, unspecified type  Insomnia, unspecified type  Acute left ankle pain  CKD (chronic kidney disease), stage III ([place]) - Plan: Microalbumin / creatinine urine ratio, VITAMIN D 25 Hydroxy (Vit-D Deficiency, Fractures)  ?  Sprain ankle, tendinitis. Good ankle support, a brace to wear as needed. ABC PT exercises. If pain is persistent we may need to consider podiatry evaluation. No changes in your medications.  Please be sure medication list is accurate. If a new problem present, please set up appointment sooner than planned today.

## 2018-05-30 ENCOUNTER — Telehealth: Payer: Self-pay | Admitting: Family Medicine

## 2018-05-30 LAB — MICROALBUMIN / CREATININE URINE RATIO
CREATININE, U: 182.5 mg/dL
MICROALB UR: 38.5 mg/dL — AB (ref 0.0–1.9)
Microalb Creat Ratio: 21.1 mg/g (ref 0.0–30.0)

## 2018-05-30 LAB — BASIC METABOLIC PANEL
BUN: 17 mg/dL (ref 6–23)
CALCIUM: 9.4 mg/dL (ref 8.4–10.5)
CO2: 23 mEq/L (ref 19–32)
CREATININE: 1.21 mg/dL — AB (ref 0.40–1.20)
Chloride: 105 mEq/L (ref 96–112)
GFR: 43.73 mL/min — AB (ref >60.00)
Glucose, Bld: 161 mg/dL — ABNORMAL HIGH (ref 70–99)
Potassium: 4.3 mEq/L (ref 3.5–5.1)
Sodium: 139 mEq/L (ref 135–145)

## 2018-05-30 LAB — VITAMIN D 25 HYDROXY (VIT D DEFICIENCY, FRACTURES): VITD: 16.51 ng/mL — ABNORMAL LOW (ref 30.00–100.00)

## 2018-05-30 NOTE — Telephone Encounter (Signed)
Pts daughter dropped off [place] [employer]ing [employer]ac[employer] Form to be completed by the provider.  Upon completion pt would like like for it to be mailed to [facility] [address] [place], [place] [address].  Form placed in providers folder for completion.

## 2018-05-31 NOTE — Telephone Encounter (Signed)
Form placed on providers desk for completion

## 2018-06-01 ENCOUNTER — Encounter: Payer: Self-pay | Admitting: Family Medicine

## 2018-06-01 MED ORDER — ALPRAZOLAM 0.25 MG PO TABS: 0.2500 mg | ORAL_TABLET | Freq: Every day | ORAL | 3 refills | Status: AC

## 2018-06-01 MED ORDER — DOXEPIN HCL 10 MG PO CAPS: 10.0000 mg | ORAL_CAPSULE | Freq: Every evening | ORAL | 1 refills | Status: DC | PRN

## 2018-06-01 MED ORDER — DULOXETINE HCL 60 MG PO CPEP: 60.0000 mg | ORAL_CAPSULE | Freq: Every day | ORAL | 2 refills | Status: DC

## 2018-06-01 NOTE — Progress Notes (Signed)
HPI:   [name] is a 73 y.o. female, who is here today for 4 months follow up.   She was last seen on 01/23/18.  CKD III: She has not noted gross hematuria,foam in urine,or decreased urine output.  HTN on Cozaar 50 mg daily,Amlodipine 10 mg daily,and Carvedilol 6.25 mg bid. Her daughter checks BP regularly  Denies severe/frequent headache, visual changes, chest pain, dyspnea, palpitation, claudication, focal weakness, or worsening edema.   Component     Latest Ref Rng & Units 07/05/2017  Sodium     135 - 145 mEq/L 137  Potassium     3.5 - 5.1 mEq/L 4.5  Chloride     96 - 112 mEq/L 102  CO2     19 - 32 mEq/L 24  Glucose     70 - 99 mg/dL 157 (H)  BUN     6 - 23 mg/dL 25 (H)  Creatinine     0.40 - 1.20 mg/dL 1.21 (H)   Anxiety on Xanax 0.25 mg daily as needed. Cymbalta 60 mg daily. Denies suicidal thoughts.  Insomnia: She is on Doxepin 10 mg daily, helping with sleep. No side effects noted. Sleeping about 8 hours. She feels rested.  Today she [place] c/o right ankle edema and mild achy pain. No erythema or limitation of ROM.  She does not recall trauma but pain started a day after long walk,longer than usual. . Pain is exacerbated by walking and alleviated by rest. She has not taken OTC treatments. She takes Percocet for chronic pain.     Review of Systems  Constitutional: Negative for activity change, appetite change, fatigue and fever.  HENT: Negative for mouth sores and nosebleeds.   Eyes: Negative for redness and visual disturbance.  Respiratory: Negative for cough, shortness of breath and wheezing.   Cardiovascular: Positive for leg swelling. Negative for chest pain and palpitations.  Gastrointestinal: Negative for abdominal pain, nausea and vomiting.       Negative for changes in bowel habits.  Genitourinary: Negative for decreased urine volume, dysuria and hematuria.  Musculoskeletal: Positive for arthralgias and joint swelling.  Skin:  Negative for rash and wound.  Neurological: Negative for syncope, weakness and headaches.  Psychiatric/Behavioral: Positive for sleep disturbance. The patient is nervous/anxious.     Current Outpatient Medications on File Prior to Visit  Medication Sig Dispense Refill  . albuterol (PROVENTIL) (2.5 MG/3ML) 0.083% nebulizer solution USE 1 VIAL IN NEBULIZER EVERY 6 HOURS AS NEEDED FOR WHEEZING OR SHORTNESS OF BREATH 150 mL 1  . amLODipine (NORVASC) 10 MG tablet Take 1 tablet (10 mg total) by mouth daily. 90 tablet 2  . atorvastatin (LIPITOR) 10 MG tablet Take 1 tablet (10 mg total) by mouth daily. 90 tablet 1  . carvedilol (COREG) 6.25 MG tablet Take 1 tablet (6.25 mg total) by mouth 2 (two) times daily. 180 tablet 2  . levothyroxine (SYNTHROID, LEVOTHROID) 88 MCG tablet Take 1 tablet (88 mcg total) by mouth daily. 90 tablet 2  . losartan (COZAAR) 50 MG tablet Take 1 tablet (50 mg total) by mouth daily. 90 tablet 2  . oxyCODONE-acetaminophen (PERCOCET) 10-325 MG tablet 1 tablet up to Four Times A Day as needed  0  . sodium chloride 0.9 % nebulizer solution Take 3 mLs as needed by nebulization (through trach). 90 mL 6  . VITAMIN E PO Take 1 tablet by mouth daily.     No current facility-administered medications on file prior to visit.  Past Medical History:  Diagnosis Date  . Anxiety   . Cancer (HCC)    laryngeal  . Hyperlipidemia   . Hypertension   . Thyroid disease    No Known Allergies  Social History   Socioeconomic History  . Marital status: Divorced    Spouse name: Not on file  . Number of children: Not on file  . Years of education: Not on file  . Highest education level: Not on file  Occupational History  . Not on file  Social Needs  . Financial resource strain: Not on file  . Food insecurity:    Worry: Not on file    Inability: Not on file  . Transportation needs:    Medical: Not on file    Non-medical: Not on file  Tobacco Use  . Smoking status: Former  Research scientist (life sciences)  . Smokeless tobacco: Never Used  Substance and Sexual Activity  . Alcohol use: Yes  . Drug use: No  . Sexual activity: Not Currently  Lifestyle  . Physical activity:    Days per week: Not on file    Minutes per session: Not on file  . Stress: Not on file  Relationships  . Social connections:    Talks on phone: Not on file    Gets together: Not on file    Attends religious service: Not on file    Active member of club or organization: Not on file    Attends meetings of clubs or organizations: Not on file    Relationship status: Not on file  Other Topics Concern  . Not on file  Social History Narrative  . Not on file    Vitals:   05/29/18 1536  BP: 122/74  Pulse: 78  Resp: 12  Temp: 98.3 F (36.8 C)  SpO2: 95%   Body mass index is 30.32 kg/m.   Wt Readings from Last 3 Encounters:  05/29/18 172 lb 8 oz (78.2 kg)  01/23/18 172 lb 8 oz (78.2 kg)  10/03/17 174 lb 8 oz (79.2 kg)     Physical Exam  Nursing note and vitals reviewed. Constitutional: She is oriented to person, place, and time. She appears well-developed. No distress.  HENT:  Head: Normocephalic and atraumatic.  Mouth/Throat: Oropharynx is clear and moist and mucous membranes are normal.  Eyes: Pupils are equal, round, and reactive to light. Conjunctivae are normal.  Cardiovascular: Normal rate and regular rhythm.  No murmur heard. Pulses:      Dorsalis pedis pulses are 2+ on the right side and 2+ on the left side.  Respiratory: Effort normal and breath sounds normal. No respiratory distress.  GI: Soft. She exhibits no mass. There is no hepatomegaly. There is no abdominal tenderness.  Musculoskeletal:        General: No edema.     Right ankle: Tenderness. Lateral malleolus and AITFL tenderness found.  Lymphadenopathy:    She has no cervical adenopathy.  Neurological: She is alert and oriented to person, place, and time. She has normal strength. No cranial nerve deficit. Gait normal.  Skin:  Skin is warm. No rash noted. No erythema.  Psychiatric: She has a normal mood and affect.  Well groomed, good eye contact.     ASSESSMENT AND PLAN:   [name] was seen today for 4 months follow-up.  Orders Placed This Encounter  Procedures  . Basic metabolic panel  . Microalbumin / creatinine urine ratio  . VITAMIN D 25 Hydroxy (Vit-D Deficiency, Fractures)  Lab Results  Component Value Date   CREATININE 1.21 (H) 05/30/2018   BUN 17 05/30/2018   NA 139 05/30/2018   K 4.3 05/30/2018   CL 105 05/30/2018   CO2 23 05/30/2018   Lab Results  Component Value Date   MICROALBUR 38.5 (H) 05/30/2018    .1. Acute right ankle pain ? Ankle sprain. I do not think imaging is needed today. ABC PT exercises recommended. Local icy hot with Lidocaine may help. F/U with podiatric if persistent.   2. Insomnia, unspecified type Well controlled with Doxepin 10 mg at bedtime. Good sleep hygiene. F/U in 5-6 months.  - doxepin (SINEQUAN) 10 MG capsule; Take 1 capsule (10 mg total) by mouth at bedtime as needed.  Dispense: 90 capsule; Refill: 1  3. Hypertension, essential, benign Adequately controlled. No changes in current management. Continue low salt diet. Eye exam recommended annually. F/U in 6 months, before if needed.  - Basic metabolic panel  4. Anxiety disorder, unspecified type Stable. No changes in Cymbalta or Xanax. Some side effects of meds discussed. F/U in 5-6 months,before if needed.  - DULoxetine (CYMBALTA) 60 MG capsule; Take 1 capsule (60 mg total) by mouth daily.  Dispense: 90 capsule; Refill: 2  5. CKD (chronic kidney disease), stage III (HCC) Avoid NSAID's. Continue Cozaar. Adequate BP controlled and hydration.  - Microalbumin / creatinine urine ratio - VITAMIN D 25 Hydroxy (Vit-D Deficiency, Fractures)    Return in about 6 months (around 11/27/2018) for f/u.      G. Martinique, MD  [facility]. [place]  office.

## 2018-06-05 NOTE — Telephone Encounter (Signed)
Form completed and mailed to address as requested.

## 2018-06-08 ENCOUNTER — Telehealth: Payer: Self-pay

## 2018-06-08 NOTE — Telephone Encounter (Signed)
Copied from [place] [id number]. Topic: General - Other >> Jun 08, 2018 11:21 AM Percell Belt A wrote: Reason for CRM: Pt called in and stated that she was going to [place] and  she had purchased a fly for 2/9- though the 2/15, she no longer able to go on the trip she is needing a letter that states that she is not able to go due to her condition with her foot.    Pt would like this letter mailed to her address.  Best number [phone]

## 2018-06-08 NOTE — Telephone Encounter (Signed)
Is it okay to supply letter?

## 2018-06-08 NOTE — Telephone Encounter (Signed)
Letter can be provided and requested. Thanks,  BJ

## 2018-06-11 ENCOUNTER — Encounter: Payer: Self-pay | Admitting: *Deleted

## 2018-06-11 NOTE — Telephone Encounter (Signed)
Letter completed and mailed to address on file as requested.

## 2018-07-11 DIAGNOSIS — Z5181 Encounter for therapeutic drug level monitoring: Secondary | ICD-10-CM | POA: Diagnosis not present

## 2018-07-11 DIAGNOSIS — M542 Cervicalgia: Secondary | ICD-10-CM | POA: Diagnosis not present

## 2018-07-11 DIAGNOSIS — Z79891 Long term (current) use of opiate analgesic: Secondary | ICD-10-CM | POA: Diagnosis not present

## 2018-07-25 ENCOUNTER — Ambulatory Visit: Payer: Self-pay | Admitting: *Deleted

## 2018-07-25 NOTE — Telephone Encounter (Signed)
Pt's daughter calling to request a note from Dr. Martinique to keep her out of work. States she is her mother's caregiver and doesn't think she should be working given the chance of exposing her mother to coronavirus. If appropriate: [place] Fax #. [phone] Daughters CB # [phone]  Reason for Disposition . [1] No COVID-19 EXPOSURE BUT [2] questions about  Protocols used: CORONAVIRUS (COVID-19) EXPOSURE-A-AH

## 2018-07-25 NOTE — Telephone Encounter (Signed)
Message sent to Dr. Martinique for review and approval. Patient's daughter stated that she was told to get letter from mother's PCP since she was the care taker for her mother.

## 2018-07-27 NOTE — Telephone Encounter (Signed)
Current CDC guidelines has not made recommendations in this regard,otherwsie many of [place] would not be allowed to work due to concerns of exposing our families (many elderly and with underlying illnesses). If Erika Preston is sick and she needs to take care of her during acute illness,this would be an indication to take some time from work to assist her while recovering.  Thanks, BJ

## 2018-07-27 NOTE — Telephone Encounter (Signed)
Spoke with Debroah Baller and gave recommendations for letter per Dr. Martinique. Debroah Baller wants to know if she can get a letter stating that the patient needs to stay home due to her medical issues. Please advise.

## 2018-07-27 NOTE — Telephone Encounter (Signed)
Patients daughter called and stated that she gave the wrong call back number the correct one is [phone]

## 2018-07-27 NOTE — Telephone Encounter (Signed)
If she needs a letter due to "her medical issues", she needs to request it from her PCP. If she was related to stay with Ms. Ambrosino, this can be provided but I would not be able to complete a FMLA. Thanks, BJ

## 2018-07-30 ENCOUNTER — Encounter: Payer: Self-pay | Admitting: *Deleted

## 2018-07-30 NOTE — Telephone Encounter (Signed)
Left message to give clinic a call back. 

## 2018-08-01 ENCOUNTER — Telehealth: Payer: Self-pay | Admitting: *Deleted

## 2018-08-01 NOTE — Telephone Encounter (Signed)
Copied from [place] [id number]. Topic: General - Other >> Aug 01, 2018  8:52 AM Berneta Levins wrote: Reason for CRM:   See Nurse Triage note from 07/30/2018. Pt called and left message on [place] 07/30/2018, returning a call to [place].  Pt states she can be reached on mobile number.

## 2018-08-01 NOTE — Telephone Encounter (Signed)
Clinic RN reached to daughter of patient. No answer. LVM for return call. CRM created.

## 2018-08-01 NOTE — Telephone Encounter (Signed)
Patient's daughter, Erika Preston, returning call to [place]. She wanted to clarify that she is not needing FMLA, just a letter stating that her mother should be homebound due to covid-19. Requesting call back from [place] to discuss further, if needed.

## 2018-08-02 NOTE — Telephone Encounter (Signed)
Letter has been created.

## 2018-08-03 DIAGNOSIS — G893 Neoplasm related pain (acute) (chronic): Secondary | ICD-10-CM | POA: Diagnosis not present

## 2018-08-03 DIAGNOSIS — M542 Cervicalgia: Secondary | ICD-10-CM | POA: Diagnosis not present

## 2018-08-03 DIAGNOSIS — Z79891 Long term (current) use of opiate analgesic: Secondary | ICD-10-CM | POA: Diagnosis not present

## 2018-08-03 DIAGNOSIS — Z5181 Encounter for therapeutic drug level monitoring: Secondary | ICD-10-CM | POA: Diagnosis not present

## 2018-08-03 NOTE — Telephone Encounter (Signed)
Letter has been completed

## 2018-08-03 NOTE — Telephone Encounter (Signed)
Returned call to Debroah Baller, left message that letter is completed, advised to call office concerning delivery of letter.

## 2018-08-06 NOTE — Telephone Encounter (Signed)
Left detailed message for Debroah Baller to return call concerning delivery of letter.

## 2018-08-08 NOTE — Telephone Encounter (Signed)
Letter placed in out going mail, sent to address on file. Nothing further needed at this time.

## 2018-08-13 DIAGNOSIS — M542 Cervicalgia: Secondary | ICD-10-CM | POA: Diagnosis not present

## 2018-08-13 DIAGNOSIS — Z79891 Long term (current) use of opiate analgesic: Secondary | ICD-10-CM | POA: Diagnosis not present

## 2018-08-13 DIAGNOSIS — Z5181 Encounter for therapeutic drug level monitoring: Secondary | ICD-10-CM | POA: Diagnosis not present

## 2018-08-13 DIAGNOSIS — G893 Neoplasm related pain (acute) (chronic): Secondary | ICD-10-CM | POA: Diagnosis not present

## 2018-08-20 ENCOUNTER — Encounter: Payer: Self-pay | Admitting: Family Medicine

## 2018-08-20 ENCOUNTER — Ambulatory Visit (INDEPENDENT_AMBULATORY_CARE_PROVIDER_SITE_OTHER): Payer: Medicare Other | Admitting: Family Medicine

## 2018-08-20 ENCOUNTER — Other Ambulatory Visit: Payer: Self-pay

## 2018-08-20 VITALS — Resp 12

## 2018-08-20 DIAGNOSIS — R059 Cough, unspecified: Secondary | ICD-10-CM

## 2018-08-20 DIAGNOSIS — R05 Cough: Secondary | ICD-10-CM | POA: Diagnosis not present

## 2018-08-20 DIAGNOSIS — J989 Respiratory disorder, unspecified: Secondary | ICD-10-CM

## 2018-08-20 DIAGNOSIS — R0989 Other specified symptoms and signs involving the circulatory and respiratory systems: Secondary | ICD-10-CM | POA: Diagnosis not present

## 2018-08-20 MED ORDER — PREDNISONE 20 MG PO TABS: 40.0000 mg | ORAL_TABLET | Freq: Every day | ORAL | 0 refills | Status: AC

## 2018-08-20 MED ORDER — BENZONATATE 100 MG PO CAPS: 200.0000 mg | ORAL_CAPSULE | Freq: Two times a day (BID) | ORAL | 0 refills | Status: AC | PRN

## 2018-08-20 NOTE — Progress Notes (Signed)
Virtual Visit via Video Note  I connected with Erika Preston on 08/20/18 at  4:30 PM EDT with her daughter by a video enabled telemedicine application and verified that I am speaking with the correct person using two identifiers.  Location patient: home Location provider:home office Persons participating in the virtual visit: patient, provider  I discussed the limitations of evaluation and management by telemedicine and the availability of in person appointments. She expressed understanding and agreed to proceed.   HPI: Erika Preston is a 73 yo with Hx of anxiety, s/p laryngeal cancer with tracheotomy, HTN,and chronic pain disorder; whose daughter is concerned about 2 weeks of "chest congestion."  She has had thicker whitish mucus through trach place. More fatigue and body aches than usual.  Productive cough, difficult to bring sputum up,denies hemoptysis. "Little" wheezing. She has not identified exacerbating factors. Alleviated by Albuterol neb. Symptoms are not related with exertion.  Denies fever,chill,changes in appetite,sore throat,chest pain,abdominal pain,nausea,vomiting,or changes in bowel habits. + Rhinorrhea and nasal congestion.  She is using Albuterol neb q 4 hours. OTC cold meds have not helped.  Symptoms are stable. Denies sick contact, COVID-19 exposure,or travel.  Her daughter does not think she needs CXR but rather oral Prednisone.  Hx of allergies.  ROS: See pertinent positives and negatives per HPI.  Past Medical History:  Diagnosis Date  . Anxiety   . Cancer (HCC)    laryngeal  . Hyperlipidemia   . Hypertension   . Thyroid disease     Past Surgical History:  Procedure Laterality Date  . LARYNGETOMY    . TRACHEOSTOMY      Family History  Problem Relation Age of Onset  . Arthritis Mother   . Cancer Neg Hx   . Diabetes Neg Hx     Social History   Socioeconomic History  . Marital status: Divorced    Spouse name: Not on file  . Number of  children: Not on file  . Years of education: Not on file  . Highest education level: Not on file  Occupational History  . Not on file  Social Needs  . Financial resource strain: Not on file  . Food insecurity:    Worry: Not on file    Inability: Not on file  . Transportation needs:    Medical: Not on file    Non-medical: Not on file  Tobacco Use  . Smoking status: Former Research scientist (life sciences)  . Smokeless tobacco: Never Used  Substance and Sexual Activity  . Alcohol use: Yes  . Drug use: No  . Sexual activity: Not Currently  Lifestyle  . Physical activity:    Days per week: Not on file    Minutes per session: Not on file  . Stress: Not on file  Relationships  . Social connections:    Talks on phone: Not on file    Gets together: Not on file    Attends religious service: Not on file    Active member of club or organization: Not on file    Attends meetings of clubs or organizations: Not on file    Relationship status: Not on file  . Intimate partner violence:    Fear of current or ex partner: Not on file    Emotionally abused: Not on file    Physically abused: Not on file    Forced sexual activity: Not on file  Other Topics Concern  . Not on file  Social History Narrative  . Not on file    Current  Outpatient Medications:  .  albuterol (PROVENTIL) (2.5 MG/3ML) 0.083% nebulizer solution, USE 1 VIAL IN NEBULIZER EVERY 6 HOURS AS NEEDED FOR WHEEZING OR SHORTNESS OF BREATH, Disp: 150 mL, Rfl: 1 .  ALPRAZolam (XANAX) 0.25 MG tablet, Take 1 tablet (0.25 mg total) by mouth at bedtime., Disp: 30 tablet, Rfl: 3 .  amLODipine (NORVASC) 10 MG tablet, Take 1 tablet (10 mg total) by mouth daily., Disp: 90 tablet, Rfl: 2 .  atorvastatin (LIPITOR) 10 MG tablet, Take 1 tablet (10 mg total) by mouth daily., Disp: 90 tablet, Rfl: 1 .  carvedilol (COREG) 6.25 MG tablet, Take 1 tablet (6.25 mg total) by mouth 2 (two) times daily., Disp: 180 tablet, Rfl: 2 .  doxepin (SINEQUAN) 10 MG capsule, Take 1  capsule (10 mg total) by mouth at bedtime as needed., Disp: 90 capsule, Rfl: 1 .  DULoxetine (CYMBALTA) 60 MG capsule, Take 1 capsule (60 mg total) by mouth daily., Disp: 90 capsule, Rfl: 2 .  levothyroxine (SYNTHROID, LEVOTHROID) 88 MCG tablet, Take 1 tablet (88 mcg total) by mouth daily., Disp: 90 tablet, Rfl: 2 .  losartan (COZAAR) 50 MG tablet, Take 1 tablet (50 mg total) by mouth daily., Disp: 90 tablet, Rfl: 2 .  oxyCODONE-acetaminophen (PERCOCET) 10-325 MG tablet, 1 tablet up to Four Times A Day as needed, Disp: , Rfl: 0 .  sodium chloride 0.9 % nebulizer solution, Take 3 mLs as needed by nebulization (through trach)., Disp: 90 mL, Rfl: 6 .  VITAMIN E PO, Take 1 tablet by mouth daily., Disp: , Rfl:   EXAM:  VITALS per patient if applicable:Resp 12   GENERAL: alert, oriented, appears well and in no acute distress  HEENT: atraumatic, conjunctiva clear, no obvious facial abnormalities on inspection.  LUNGS: on inspection no signs of respiratory distress, breathing rate appears normal, no obvious gross SOB, gasping or wheezing. No coughing during visit.  CV: no obvious cyanosis  PSYCH/NEURO: pleasant and cooperative, no obvious depression. + Anxiety, speech and thought processing grossly intact  ASSESSMENT AND PLAN:  Discussed the following assessment and plan:  Reactive airway disease that is not asthma - Plan: predniSONE (DELTASONE) 20 MG tablet We discussed side effects of Albuterol neb, recommend to use it max 4 times per day. She also has saline sl to use when she has tick mucus around trach,recommend alternate saline with Albuterol. Side effects of Prednisone reviewed. She will try Prednisone 40 mg daily for 3-5 days. Will plan on arranging CXR in the office.  Cough - Plan: benzonatate (TESSALON) 100 MG capsule We discussed possible etiologies, ?residual from recent URI,allergies,COPD among some. Based on Hx and inspection I do not think she needs to be evaluated in urgent  care. Her daughter does not think she needs a CXR. Benzonatate may help. Adequate hydration and Mucinex (plain) also recommended.  No problem-specific Assessment & Plan notes found for this encounter.     I discussed the assessment and treatment plan with the patient. The patient was provided an opportunity to ask questions and all were answered. Pt and daughter agreed with the plan and demonstrated an understanding of the instructions.   The patient was advised to call back or seek an in-person evaluation if the symptoms worsen or if the condition fails to improve as anticipated.  Return if symptoms worsen or fail to improve.    Devanta Daniel Martinique, MD

## 2018-09-12 DIAGNOSIS — M542 Cervicalgia: Secondary | ICD-10-CM | POA: Diagnosis not present

## 2018-09-12 DIAGNOSIS — Z79891 Long term (current) use of opiate analgesic: Secondary | ICD-10-CM | POA: Diagnosis not present

## 2018-09-12 DIAGNOSIS — Z5181 Encounter for therapeutic drug level monitoring: Secondary | ICD-10-CM | POA: Diagnosis not present

## 2018-09-12 DIAGNOSIS — G893 Neoplasm related pain (acute) (chronic): Secondary | ICD-10-CM | POA: Diagnosis not present

## 2018-10-09 DIAGNOSIS — Z79891 Long term (current) use of opiate analgesic: Secondary | ICD-10-CM | POA: Diagnosis not present

## 2018-10-09 DIAGNOSIS — M542 Cervicalgia: Secondary | ICD-10-CM | POA: Diagnosis not present

## 2018-10-09 DIAGNOSIS — G893 Neoplasm related pain (acute) (chronic): Secondary | ICD-10-CM | POA: Diagnosis not present

## 2018-10-09 DIAGNOSIS — Z5181 Encounter for therapeutic drug level monitoring: Secondary | ICD-10-CM | POA: Diagnosis not present

## 2018-11-09 DIAGNOSIS — G893 Neoplasm related pain (acute) (chronic): Secondary | ICD-10-CM | POA: Diagnosis not present

## 2018-11-09 DIAGNOSIS — Z5181 Encounter for therapeutic drug level monitoring: Secondary | ICD-10-CM | POA: Diagnosis not present

## 2018-11-09 DIAGNOSIS — Z79891 Long term (current) use of opiate analgesic: Secondary | ICD-10-CM | POA: Diagnosis not present

## 2018-11-09 DIAGNOSIS — M542 Cervicalgia: Secondary | ICD-10-CM | POA: Diagnosis not present

## 2018-12-10 DIAGNOSIS — G893 Neoplasm related pain (acute) (chronic): Secondary | ICD-10-CM | POA: Diagnosis not present

## 2018-12-10 DIAGNOSIS — Z79891 Long term (current) use of opiate analgesic: Secondary | ICD-10-CM | POA: Diagnosis not present

## 2018-12-10 DIAGNOSIS — M542 Cervicalgia: Secondary | ICD-10-CM | POA: Diagnosis not present

## 2018-12-10 DIAGNOSIS — Z5181 Encounter for therapeutic drug level monitoring: Secondary | ICD-10-CM | POA: Diagnosis not present

## 2019-01-11 DIAGNOSIS — M542 Cervicalgia: Secondary | ICD-10-CM | POA: Diagnosis not present

## 2019-01-11 DIAGNOSIS — Z5181 Encounter for therapeutic drug level monitoring: Secondary | ICD-10-CM | POA: Diagnosis not present

## 2019-01-11 DIAGNOSIS — Z79891 Long term (current) use of opiate analgesic: Secondary | ICD-10-CM | POA: Diagnosis not present

## 2019-01-11 DIAGNOSIS — G893 Neoplasm related pain (acute) (chronic): Secondary | ICD-10-CM | POA: Diagnosis not present

## 2019-02-08 DIAGNOSIS — M542 Cervicalgia: Secondary | ICD-10-CM | POA: Diagnosis not present

## 2019-02-08 DIAGNOSIS — Z5181 Encounter for therapeutic drug level monitoring: Secondary | ICD-10-CM | POA: Diagnosis not present

## 2019-02-08 DIAGNOSIS — Z79891 Long term (current) use of opiate analgesic: Secondary | ICD-10-CM | POA: Diagnosis not present

## 2019-02-08 DIAGNOSIS — G893 Neoplasm related pain (acute) (chronic): Secondary | ICD-10-CM | POA: Diagnosis not present

## 2019-03-06 ENCOUNTER — Other Ambulatory Visit: Payer: Self-pay | Admitting: Family Medicine

## 2019-03-06 DIAGNOSIS — F419 Anxiety disorder, unspecified: Secondary | ICD-10-CM

## 2019-03-06 DIAGNOSIS — G4700 Insomnia, unspecified: Secondary | ICD-10-CM

## 2019-03-11 DIAGNOSIS — Z79891 Long term (current) use of opiate analgesic: Secondary | ICD-10-CM | POA: Diagnosis not present

## 2019-03-11 DIAGNOSIS — J9500 Unspecified tracheostomy complication: Secondary | ICD-10-CM | POA: Diagnosis not present

## 2019-03-11 DIAGNOSIS — G893 Neoplasm related pain (acute) (chronic): Secondary | ICD-10-CM | POA: Diagnosis not present

## 2019-03-11 DIAGNOSIS — L599 Disorder of the skin and subcutaneous tissue related to radiation, unspecified: Secondary | ICD-10-CM | POA: Diagnosis not present

## 2019-03-11 DIAGNOSIS — Z5181 Encounter for therapeutic drug level monitoring: Secondary | ICD-10-CM | POA: Diagnosis not present

## 2019-03-11 DIAGNOSIS — C323 Malignant neoplasm of laryngeal cartilage: Secondary | ICD-10-CM | POA: Diagnosis not present

## 2019-03-18 ENCOUNTER — Other Ambulatory Visit: Payer: Self-pay | Admitting: Family Medicine

## 2019-03-18 DIAGNOSIS — I10 Essential (primary) hypertension: Secondary | ICD-10-CM

## 2019-03-20 ENCOUNTER — Other Ambulatory Visit: Payer: Self-pay | Admitting: Family Medicine

## 2019-03-20 DIAGNOSIS — I10 Essential (primary) hypertension: Secondary | ICD-10-CM

## 2019-05-22 ENCOUNTER — Telehealth: Payer: Self-pay | Admitting: Family Medicine

## 2019-05-22 DIAGNOSIS — F419 Anxiety disorder, unspecified: Secondary | ICD-10-CM

## 2019-05-22 DIAGNOSIS — E785 Hyperlipidemia, unspecified: Secondary | ICD-10-CM

## 2019-05-22 DIAGNOSIS — E039 Hypothyroidism, unspecified: Secondary | ICD-10-CM

## 2019-05-22 DIAGNOSIS — I10 Essential (primary) hypertension: Secondary | ICD-10-CM

## 2019-05-22 MED ORDER — DULOXETINE HCL 60 MG PO CPEP: 60.0000 mg | ORAL_CAPSULE | Freq: Every day | ORAL | 0 refills | Status: AC

## 2019-05-22 MED ORDER — LOSARTAN POTASSIUM 50 MG PO TABS: 50.0000 mg | ORAL_TABLET | Freq: Every day | ORAL | 0 refills | Status: AC

## 2019-05-22 MED ORDER — LEVOTHYROXINE SODIUM 88 MCG PO TABS: 88.0000 ug | ORAL_TABLET | Freq: Every day | ORAL | 0 refills | Status: AC

## 2019-05-22 MED ORDER — CARVEDILOL 6.25 MG PO TABS: 6.2500 mg | ORAL_TABLET | Freq: Two times a day (BID) | ORAL | 0 refills | Status: AC

## 2019-05-22 MED ORDER — AMLODIPINE BESYLATE 10 MG PO TABS: 10.0000 mg | ORAL_TABLET | Freq: Every day | ORAL | 0 refills | Status: AC

## 2019-05-22 MED ORDER — ATORVASTATIN CALCIUM 10 MG PO TABS: 10.0000 mg | ORAL_TABLET | Freq: Every day | ORAL | 0 refills | Status: AC

## 2019-05-22 NOTE — Telephone Encounter (Signed)
RX REFILL levothyroxine (SYNTHROID, LEVOTHROID) 88 MCG tablet  carvedilol (COREG) 6.25 MG tablet  amLODipine (NORVASC) 10 MG tablet  DULoxetine (CYMBALTA) 60 MG capsule  atorvastatin (LIPITOR) 10 MG tablet  losartan (COZAAR) 50 MG tablet  [place], [address] Phone:  [phone]  Fax:  [phone]     Patient call back [phone]

## 2019-05-22 NOTE — Telephone Encounter (Signed)
Rx's sent in as requested. 

## 2019-05-23 DIAGNOSIS — H52223 Regular astigmatism, bilateral: Secondary | ICD-10-CM | POA: Diagnosis not present

## 2019-05-23 DIAGNOSIS — H2513 Age-related nuclear cataract, bilateral: Secondary | ICD-10-CM | POA: Diagnosis not present

## 2019-05-23 DIAGNOSIS — H43392 Other vitreous opacities, left eye: Secondary | ICD-10-CM | POA: Diagnosis not present

## 2019-06-03 DIAGNOSIS — L599 Disorder of the skin and subcutaneous tissue related to radiation, unspecified: Secondary | ICD-10-CM | POA: Diagnosis not present

## 2019-06-03 DIAGNOSIS — J9500 Unspecified tracheostomy complication: Secondary | ICD-10-CM | POA: Diagnosis not present

## 2019-06-03 DIAGNOSIS — C323 Malignant neoplasm of laryngeal cartilage: Secondary | ICD-10-CM | POA: Diagnosis not present

## 2019-06-03 DIAGNOSIS — G893 Neoplasm related pain (acute) (chronic): Secondary | ICD-10-CM | POA: Diagnosis not present

## 2019-06-03 DIAGNOSIS — Z5181 Encounter for therapeutic drug level monitoring: Secondary | ICD-10-CM | POA: Diagnosis not present

## 2019-06-03 DIAGNOSIS — Z79891 Long term (current) use of opiate analgesic: Secondary | ICD-10-CM | POA: Diagnosis not present

## 2019-06-11 DIAGNOSIS — H2511 Age-related nuclear cataract, right eye: Secondary | ICD-10-CM | POA: Diagnosis not present

## 2019-06-11 DIAGNOSIS — H25812 Combined forms of age-related cataract, left eye: Secondary | ICD-10-CM | POA: Diagnosis not present

## 2019-06-11 DIAGNOSIS — H52223 Regular astigmatism, bilateral: Secondary | ICD-10-CM | POA: Diagnosis not present

## 2019-06-11 DIAGNOSIS — H3581 Retinal edema: Secondary | ICD-10-CM | POA: Diagnosis not present

## 2019-06-27 ENCOUNTER — Other Ambulatory Visit: Payer: Self-pay | Admitting: Family Medicine

## 2019-06-27 DIAGNOSIS — G4700 Insomnia, unspecified: Secondary | ICD-10-CM

## 2019-07-03 DIAGNOSIS — Z79891 Long term (current) use of opiate analgesic: Secondary | ICD-10-CM | POA: Diagnosis not present

## 2019-07-03 DIAGNOSIS — L599 Disorder of the skin and subcutaneous tissue related to radiation, unspecified: Secondary | ICD-10-CM | POA: Diagnosis not present

## 2019-07-03 DIAGNOSIS — G893 Neoplasm related pain (acute) (chronic): Secondary | ICD-10-CM | POA: Diagnosis not present

## 2019-07-03 DIAGNOSIS — Z5181 Encounter for therapeutic drug level monitoring: Secondary | ICD-10-CM | POA: Diagnosis not present

## 2019-07-03 DIAGNOSIS — C323 Malignant neoplasm of laryngeal cartilage: Secondary | ICD-10-CM | POA: Diagnosis not present

## 2019-07-03 DIAGNOSIS — J9500 Unspecified tracheostomy complication: Secondary | ICD-10-CM | POA: Diagnosis not present

## 2019-07-09 DIAGNOSIS — H33192 Other retinoschisis and retinal cysts, left eye: Secondary | ICD-10-CM | POA: Diagnosis not present

## 2019-07-09 DIAGNOSIS — H33199 Other retinoschisis and retinal cysts, unspecified eye: Secondary | ICD-10-CM | POA: Diagnosis not present

## 2019-07-09 DIAGNOSIS — H43821 Vitreomacular adhesion, right eye: Secondary | ICD-10-CM | POA: Diagnosis not present

## 2019-07-09 DIAGNOSIS — H3581 Retinal edema: Secondary | ICD-10-CM | POA: Diagnosis not present

## 2019-07-17 DIAGNOSIS — H2511 Age-related nuclear cataract, right eye: Secondary | ICD-10-CM | POA: Diagnosis not present

## 2019-07-17 DIAGNOSIS — H52221 Regular astigmatism, right eye: Secondary | ICD-10-CM | POA: Diagnosis not present

## 2019-07-17 DIAGNOSIS — H401111 Primary open-angle glaucoma, right eye, mild stage: Secondary | ICD-10-CM | POA: Diagnosis not present

## 2019-07-31 DIAGNOSIS — H401121 Primary open-angle glaucoma, left eye, mild stage: Secondary | ICD-10-CM | POA: Diagnosis not present

## 2019-07-31 DIAGNOSIS — H25812 Combined forms of age-related cataract, left eye: Secondary | ICD-10-CM | POA: Diagnosis not present

## 2020-10-22 ENCOUNTER — Telehealth: Payer: Self-pay | Admitting: Family Medicine

## 2020-10-22 NOTE — Telephone Encounter (Signed)
Left message for patient to call back and schedule Medicare Annual Wellness Visit (AWV) either virtually or in office.   Last AWV AWV-I per PALMETTO 10/31/15 please schedule at anytime with [facility] Nurse Health Advisor 1 or 2  Patient also needs appointment with pcp last appointment 08/20/2018  This should be a 45 minute visit.

## 2020-11-23 ENCOUNTER — Telehealth: Payer: Self-pay | Admitting: Family Medicine

## 2020-11-23 NOTE — Telephone Encounter (Signed)
Left message for patient to call back and schedule Medicare Annual Wellness Visit (AWV) either virtually or in office.   AWV-I per PALMETTO 10/31/15  please schedule at anytime with [facility] Nurse Health Advisor 1 or 2  Patient also needs appointment with PCP last appointment 08/20/2018   This should be a 45 minute visit.

## 2020-12-23 ENCOUNTER — Telehealth: Payer: Self-pay | Admitting: Family Medicine

## 2020-12-23 NOTE — Telephone Encounter (Signed)
Left message for patient to call back and schedule Medicare Annual Wellness Visit (AWV) either virtually or in office.  Left  my Herbie Drape number [phone]   AWV-I per PALMETTO 10/31/15    please schedule at anytime with [facility] Nurse Health Advisor 1 or 2  Patient also need appointment with pcp last appointment 08/20/2018  This should be a 45 minute visit.

## 2021-03-18 ENCOUNTER — Telehealth: Payer: Self-pay | Admitting: Family Medicine

## 2021-03-18 NOTE — Telephone Encounter (Signed)
Left message for patient to call back and schedule Medicare Annual Wellness Visit (AWV) either virtually or in office. Left  my Erika Preston number [phone]    AWV-I per PALMETTO 10/31/15  please schedule at anytime with [facility] Nurse Health Advisor 1 or 2   This should be a 45 minute visit.   Patient has address in [place]  please confirm dr Martinique still pcp
